# Patient Record
Sex: Male | Born: 1980 | Race: White | Hispanic: No | Smoking: Current every day smoker
Health system: Southern US, Community
[De-identification: ages and names within clinical notes are randomized; demographics above are authoritative.]

## PROBLEM LIST (undated history)

## (undated) DIAGNOSIS — C801 Malignant (primary) neoplasm, unspecified: Secondary | ICD-10-CM

## (undated) DIAGNOSIS — F1910 Other psychoactive substance abuse, uncomplicated: Secondary | ICD-10-CM

## (undated) DIAGNOSIS — F419 Anxiety disorder, unspecified: Secondary | ICD-10-CM

## (undated) HISTORY — DX: Malignant (primary) neoplasm, unspecified: C80.1

---

## 2004-06-26 ENCOUNTER — Emergency Department (HOSPITAL_COMMUNITY): Admission: EM | Admit: 2004-06-26 | Discharge: 2004-06-26 | Payer: Self-pay | Admitting: Emergency Medicine

## 2004-07-08 ENCOUNTER — Ambulatory Visit: Payer: Self-pay | Admitting: Psychiatry

## 2004-07-08 ENCOUNTER — Inpatient Hospital Stay (HOSPITAL_COMMUNITY): Admission: AD | Admit: 2004-07-08 | Discharge: 2004-07-10 | Payer: Self-pay | Admitting: Psychiatry

## 2004-07-08 ENCOUNTER — Encounter: Payer: Self-pay | Admitting: Emergency Medicine

## 2004-09-03 ENCOUNTER — Emergency Department (HOSPITAL_COMMUNITY): Admission: EM | Admit: 2004-09-03 | Discharge: 2004-09-03 | Payer: Self-pay | Admitting: Emergency Medicine

## 2004-10-14 ENCOUNTER — Emergency Department (HOSPITAL_COMMUNITY): Admission: EM | Admit: 2004-10-14 | Discharge: 2004-10-14 | Payer: Self-pay | Admitting: Emergency Medicine

## 2005-12-08 ENCOUNTER — Inpatient Hospital Stay (HOSPITAL_COMMUNITY): Admission: EM | Admit: 2005-12-08 | Discharge: 2005-12-13 | Payer: Self-pay

## 2006-07-27 ENCOUNTER — Emergency Department (HOSPITAL_COMMUNITY): Admission: EM | Admit: 2006-07-27 | Discharge: 2006-07-28 | Payer: Self-pay | Admitting: Emergency Medicine

## 2009-06-08 ENCOUNTER — Emergency Department (HOSPITAL_COMMUNITY): Admission: EM | Admit: 2009-06-08 | Discharge: 2009-06-09 | Payer: Self-pay | Admitting: Emergency Medicine

## 2010-08-27 ENCOUNTER — Emergency Department (HOSPITAL_COMMUNITY)
Admission: EM | Admit: 2010-08-27 | Discharge: 2010-08-27 | Disposition: A | Discharge status: Home / Self Care | Payer: Self-pay | Attending: Emergency Medicine | Admitting: Emergency Medicine

## 2010-08-27 ENCOUNTER — Emergency Department (HOSPITAL_COMMUNITY): Payer: Self-pay

## 2010-08-27 DIAGNOSIS — L03119 Cellulitis of unspecified part of limb: Secondary | ICD-10-CM | POA: Insufficient documentation

## 2010-08-27 DIAGNOSIS — F17200 Nicotine dependence, unspecified, uncomplicated: Secondary | ICD-10-CM | POA: Insufficient documentation

## 2010-08-27 DIAGNOSIS — M25579 Pain in unspecified ankle and joints of unspecified foot: Secondary | ICD-10-CM | POA: Insufficient documentation

## 2010-08-27 DIAGNOSIS — L02419 Cutaneous abscess of limb, unspecified: Secondary | ICD-10-CM | POA: Insufficient documentation

## 2010-08-27 LAB — DIFFERENTIAL
Basophils Absolute: 0 10*3/uL (ref 0.0–0.1)
Basophils Relative: 0 % (ref 0–1)
Eosinophils Relative: 1 % (ref 0–5)
Lymphocytes Relative: 25 % (ref 12–46)
Lymphs Abs: 2.8 10*3/uL (ref 0.7–4.0)
Monocytes Absolute: 1.1 10*3/uL — ABNORMAL HIGH (ref 0.1–1.0)
Neutro Abs: 7.1 10*3/uL (ref 1.7–7.7)
Neutrophils Relative %: 64 % (ref 43–77)

## 2010-08-27 LAB — CBC
HCT: 39.3 % (ref 39.0–52.0)
MCH: 33 pg (ref 26.0–34.0)
MCHC: 35.4 g/dL (ref 30.0–36.0)
MCV: 93.3 fL (ref 78.0–100.0)
Platelets: 286 10*3/uL (ref 150–400)
RDW: 12.6 % (ref 11.5–15.5)

## 2010-08-27 LAB — POCT I-STAT, CHEM 8
Calcium, Ion: 1.2 mmol/L (ref 1.12–1.32)
Glucose, Bld: 115 mg/dL — ABNORMAL HIGH (ref 70–99)
HCT: 43 % (ref 39.0–52.0)
Hemoglobin: 14.6 g/dL (ref 13.0–17.0)
Potassium: 3.7 mEq/L (ref 3.5–5.1)
Sodium: 139 mEq/L (ref 135–145)
TCO2: 27 mmol/L (ref 0–100)
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
## 2010-12-10 ENCOUNTER — Emergency Department (HOSPITAL_COMMUNITY)
Admission: EM | Admit: 2010-12-10 | Discharge: 2010-12-10 | Disposition: A | Discharge status: Home / Self Care | Payer: Self-pay | Attending: Emergency Medicine | Admitting: Emergency Medicine

## 2010-12-10 ENCOUNTER — Emergency Department (HOSPITAL_COMMUNITY): Payer: Self-pay

## 2010-12-10 DIAGNOSIS — M79609 Pain in unspecified limb: Secondary | ICD-10-CM | POA: Insufficient documentation

## 2010-12-10 DIAGNOSIS — M7989 Other specified soft tissue disorders: Secondary | ICD-10-CM | POA: Insufficient documentation

## 2010-12-27 ENCOUNTER — Other Ambulatory Visit (HOSPITAL_COMMUNITY): Payer: Self-pay | Admitting: Orthopedic Surgery

## 2010-12-27 DIAGNOSIS — M7989 Other specified soft tissue disorders: Secondary | ICD-10-CM

## 2010-12-29 ENCOUNTER — Inpatient Hospital Stay (HOSPITAL_COMMUNITY): Admission: RE | Admit: 2010-12-29 | Payer: Self-pay | Source: Ambulatory Visit

## 2011-01-02 ENCOUNTER — Ambulatory Visit (HOSPITAL_COMMUNITY)
Admission: RE | Admit: 2011-01-02 | Discharge: 2011-01-02 | Disposition: A | Discharge status: Home / Self Care | Payer: Self-pay | Source: Ambulatory Visit | Attending: Orthopedic Surgery | Admitting: Orthopedic Surgery

## 2011-01-02 DIAGNOSIS — M65839 Other synovitis and tenosynovitis, unspecified forearm: Secondary | ICD-10-CM | POA: Insufficient documentation

## 2011-01-02 DIAGNOSIS — M25549 Pain in joints of unspecified hand: Secondary | ICD-10-CM | POA: Insufficient documentation

## 2011-01-02 DIAGNOSIS — M25449 Effusion, unspecified hand: Secondary | ICD-10-CM | POA: Insufficient documentation

## 2011-01-02 DIAGNOSIS — R229 Localized swelling, mass and lump, unspecified: Secondary | ICD-10-CM | POA: Insufficient documentation

## 2011-01-02 DIAGNOSIS — M7989 Other specified soft tissue disorders: Secondary | ICD-10-CM

## 2011-01-02 MED ORDER — GADOBENATE DIMEGLUMINE 529 MG/ML IV SOLN
15.0000 mL | Freq: Once | INTRAVENOUS | Status: AC
  Administered 2011-01-02: 15 mL via INTRAVENOUS

## 2012-08-02 ENCOUNTER — Encounter (HOSPITAL_COMMUNITY): Payer: Self-pay | Admitting: Emergency Medicine

## 2012-08-02 ENCOUNTER — Emergency Department (HOSPITAL_COMMUNITY)
Admission: EM | Admit: 2012-08-02 | Discharge: 2012-08-02 | Disposition: A | Discharge status: Home / Self Care | Payer: Self-pay | Attending: Emergency Medicine | Admitting: Emergency Medicine

## 2012-08-02 ENCOUNTER — Emergency Department (HOSPITAL_COMMUNITY): Payer: Self-pay

## 2012-08-02 DIAGNOSIS — R3915 Urgency of urination: Secondary | ICD-10-CM | POA: Insufficient documentation

## 2012-08-02 DIAGNOSIS — R350 Frequency of micturition: Secondary | ICD-10-CM | POA: Insufficient documentation

## 2012-08-02 DIAGNOSIS — F17200 Nicotine dependence, unspecified, uncomplicated: Secondary | ICD-10-CM | POA: Insufficient documentation

## 2012-08-02 DIAGNOSIS — R300 Dysuria: Secondary | ICD-10-CM | POA: Insufficient documentation

## 2012-08-02 LAB — COMPREHENSIVE METABOLIC PANEL
ALT: 15 U/L (ref 0–53)
Albumin: 4.8 g/dL (ref 3.5–5.2)
Alkaline Phosphatase: 71 U/L (ref 39–117)
BUN: 9 mg/dL (ref 6–23)
CO2: 28 mEq/L (ref 19–32)
Calcium: 9.7 mg/dL (ref 8.4–10.5)
GFR calc Af Amer: >90 mL/min (ref >90)
Potassium: 3.9 mEq/L (ref 3.5–5.1)
Sodium: 139 mEq/L (ref 135–145)
Total Bilirubin: 0.2 mg/dL — ABNORMAL LOW (ref 0.3–1.2)
Total Protein: 7.5 g/dL (ref 6.0–8.3)

## 2012-08-02 LAB — CBC WITH DIFFERENTIAL/PLATELET
Basophils Absolute: 0.1 10*3/uL (ref 0.0–0.1)
Basophils Relative: 1 % (ref 0–1)
Eosinophils Absolute: 0.2 10*3/uL (ref 0.0–0.7)
Eosinophils Relative: 2 % (ref 0–5)
HCT: 43.1 % (ref 39.0–52.0)
Hemoglobin: 15.4 g/dL (ref 13.0–17.0)
Lymphs Abs: 2.2 10*3/uL (ref 0.7–4.0)
MCH: 33.3 pg (ref 26.0–34.0)
MCHC: 35.7 g/dL (ref 30.0–36.0)
MCV: 93.1 fL (ref 78.0–100.0)
Monocytes Absolute: 0.7 10*3/uL (ref 0.1–1.0)
Neutro Abs: 5 10*3/uL (ref 1.7–7.7)
Neutrophils Relative %: 61 % (ref 43–77)
Platelets: 316 10*3/uL (ref 150–400)

## 2012-08-02 LAB — URINALYSIS, ROUTINE W REFLEX MICROSCOPIC
Bilirubin Urine: NEGATIVE
Glucose, UA: NEGATIVE mg/dL
Ketones, ur: NEGATIVE mg/dL
Leukocytes, UA: NEGATIVE
Nitrite: NEGATIVE
Protein, ur: NEGATIVE mg/dL
Specific Gravity, Urine: 1.005 (ref 1.005–1.030)
Urobilinogen, UA: 0.2 mg/dL (ref 0.0–1.0)

## 2012-08-02 LAB — RAPID URINE DRUG SCREEN, HOSP PERFORMED: Opiates: NOT DETECTED

## 2012-08-02 MED ORDER — ONDANSETRON HCL 4 MG/2ML IJ SOLN
4.0000 mg | Freq: Once | INTRAMUSCULAR | Status: AC
  Administered 2012-08-02: 4 mg via INTRAVENOUS
  Filled 2012-08-02: qty 2

## 2012-08-02 MED ORDER — SODIUM CHLORIDE 0.9 % IV BOLUS (SEPSIS)
1000.0000 mL | Freq: Once | INTRAVENOUS | Status: AC
  Administered 2012-08-02: 1000 mL via INTRAVENOUS

## 2012-08-02 MED ORDER — HYDROMORPHONE HCL PF 1 MG/ML IJ SOLN
1.0000 mg | Freq: Once | INTRAMUSCULAR | Status: AC
  Administered 2012-08-02: 1 mg via INTRAVENOUS
  Filled 2012-08-02: qty 1

## 2012-08-02 MED ORDER — PHENAZOPYRIDINE HCL 200 MG PO TABS: 200.0000 mg | ORAL_TABLET | Freq: Three times a day (TID) | ORAL | Status: DC

## 2012-08-02 MED ORDER — CEFTRIAXONE SODIUM 250 MG IJ SOLR
250.0000 mg | Freq: Once | INTRAMUSCULAR | Status: AC
  Administered 2012-08-02: 250 mg via INTRAMUSCULAR
  Filled 2012-08-02: qty 250

## 2012-08-02 MED ORDER — MORPHINE SULFATE 4 MG/ML IJ SOLN
4.0000 mg | Freq: Once | INTRAMUSCULAR | Status: AC
  Administered 2012-08-02: 4 mg via INTRAVENOUS
  Filled 2012-08-02: qty 1

## 2012-08-02 MED ORDER — LORAZEPAM 2 MG/ML IJ SOLN
1.0000 mg | Freq: Once | INTRAMUSCULAR | Status: AC
  Administered 2012-08-02: 1 mg via INTRAVENOUS
  Filled 2012-08-02: qty 1

## 2012-08-02 MED ORDER — AZITHROMYCIN 250 MG PO TABS
1000.0000 mg | ORAL_TABLET | Freq: Once | ORAL | Status: AC
  Administered 2012-08-02: 1000 mg via ORAL
  Filled 2012-08-02: qty 4

## 2012-08-02 MED ORDER — HYDROCODONE-ACETAMINOPHEN 5-325 MG PO TABS: 2.0000 | ORAL_TABLET | ORAL | Status: DC | PRN

## 2012-08-02 NOTE — ED Notes (Signed)
Pt states "it feels like I always have to go to the bathroom." Pt states its hard to go to the bathroom when he does have to go; pt alert and mentating appropriately.

## 2012-08-02 NOTE — ED Provider Notes (Signed)
History     CSN: [id number]  Arrival date & time 08/02/12  1245   First MD Initiated Contact with Patient 08/02/12 1303      Chief Complaint  Patient presents with  . Abdominal Pain    (Consider location/radiation/quality/duration/timing/severity/associated sxs/prior treatment) HPI Comments: Patient complains of suprapubic pain and pressure for the past 2 days. He states he has urinary frequency was only able to void small amounts. States he was able to urinate at all yesterday except for a few drops. Denies fever, chills, nausea vomiting. Denies testicular pain or penile discharge. Denies any back pain. . No history of kidney stones or urinary tract infections.  Patient is a 32 y.o. male presenting with abdominal pain. The history is provided by the patient.  Abdominal Pain The primary symptoms of the illness include abdominal pain and dysuria. The primary symptoms of the illness do not include fever, shortness of breath, nausea or vomiting.  The dysuria is associated with frequency and urgency. The dysuria is not associated with hematuria.   Additional symptoms associated with the illness include urgency and frequency. Symptoms associated with the illness do not include hematuria or back pain.    History reviewed. No pertinent past medical history.  History reviewed. No pertinent past surgical history.  No family history on file.  History  Substance Use Topics  . Smoking status: Current Some Day Smoker  . Smokeless tobacco: Not on file  . Alcohol Use: No      Review of Systems  Constitutional: Negative for fever and activity change.  HENT: Negative for congestion and rhinorrhea.   Respiratory: Negative for cough, chest tightness and shortness of breath.   Cardiovascular: Negative for chest pain.  Gastrointestinal: Positive for abdominal pain. Negative for nausea and vomiting.  Genitourinary: Positive for dysuria, urgency, frequency, decreased urine volume and difficulty  urinating. Negative for hematuria, flank pain, penile swelling, scrotal swelling and testicular pain.  Musculoskeletal: Negative for back pain.  Skin: Negative for rash.  Neurological: Negative for dizziness, weakness and headaches.  A complete 10 system review of systems was obtained and all systems are negative except as noted in the HPI and PMH.    Allergies  Penicillins  Home Medications   Current Outpatient Rx  Name  Route  Sig  Dispense  Refill  . HYDROCODONE-ACETAMINOPHEN 5-325 MG PO TABS   Oral   Take 2 tablets by mouth every 4 (four) hours as needed for pain.   10 tablet   0   . PHENAZOPYRIDINE HCL 200 MG PO TABS   Oral   Take 1 tablet (200 mg total) by mouth 3 (three) times daily.   6 tablet   0     BP 131/75  Pulse 74  Temp 98.4 F (36.9 C) (Oral)  Resp 14  SpO2 96%  Physical Exam  Constitutional: He is oriented to person, place, and time. He appears well-developed and well-nourished. He appears distressed.       Uncomfortable, walking around room  HENT:  Head: Normocephalic and atraumatic.  Mouth/Throat: Oropharynx is clear and moist. No oropharyngeal exudate.  Eyes: Conjunctivae normal and EOM are normal. Pupils are equal, round, and reactive to light.  Neck: Normal range of motion. Neck supple.  Cardiovascular: Normal rate, regular rhythm and normal heart sounds.   No murmur heard. Pulmonary/Chest: Effort normal and breath sounds normal. No respiratory distress.  Abdominal: Soft. There is tenderness. There is no rebound and no guarding.       Suprapubic  tenderness with palpable bladder  Musculoskeletal: Normal range of motion. He exhibits no edema and no tenderness.       No CVAT  Neurological: He is alert and oriented to person, place, and time. No cranial nerve deficit. He exhibits normal muscle tone. Coordination normal.       5/5 strength in bilateral lower extremities. Ankle plantar and dorsiflexion intact. Great toe extension intact bilaterally.  +2 DP and PT pulses. +2 patellar reflexes bilaterally. Normal gait.   Skin: Skin is warm.    ED Course  Procedures (including critical care time)  Labs Reviewed  URINALYSIS, ROUTINE W REFLEX MICROSCOPIC - Abnormal; Notable for the following:    Hgb urine dipstick TRACE (*)     All other components within normal limits  COMPREHENSIVE METABOLIC PANEL - Abnormal; Notable for the following:    Glucose, Bld 104 (*)     Total Bilirubin 0.2 (*)     All other components within normal limits  GLUCOSE, CAPILLARY - Abnormal; Notable for the following:    Glucose-Capillary 106 (*)     All other components within normal limits  CBC WITH DIFFERENTIAL  URINE MICROSCOPIC-ADD ON  GC/CHLAMYDIA PROBE AMP  URINE CULTURE  URINE RAPID DRUG SCREEN (HOSP PERFORMED)   Ct Abdomen Pelvis Wo Contrast  08/02/2012  *RADIOLOGY REPORT*  Clinical Data: Trouble urinating, frequent urination, lower abdominal pain and pressure  CT ABDOMEN AND PELVIS WITHOUT CONTRAST  Technique:  Multidetector CT imaging of the abdomen and pelvis was performed following the standard protocol without intravenous contrast.  Comparison: CT abdomen pelvis - 12/08/2005  Findings:  The lack of intravenous contrast limits the ability to evaluate solid abdominal organs.  Normal noncontrast appearance of the bilateral kidneys.  No renal stones.  No stones are seen within the expected location of either ureter or the urinary bladder.  The urinary bladder is normal in degree of distension.  No urine obstruction or perinephric stranding.  Minimal prostatic calcifications.  No free fluid in the pelvis.  Normal noncontrast appearance of the bilateral adrenal glands, pancreas and spleen.  Incidental note is made of a small splenule.  Normal hepatic contour.  Normal noncontrast appearance of the gallbladder.  No ascites.  Moderate to large colonic stool burden without evidence of obstruction.  The bowel is otherwise normal in course and caliber without wall  thickening.  Normal noncontrast appearance of the appendix.  No pneumoperitoneum, pneumatosis or portal venous gas.  Normal caliber of the abdominal aorta.  No definite retroperitoneal, mesenteric, pelvic or inguinal lymphadenopathy on this noncontrast examination.   Limited visualization of the lower thorax demonstrates an approximately 3 mm nodule within the left lower lobe (image six, series 4) favored to be sequela of prior infection or inflammation. No focal airspace opacity.  No pleural effusion.  Normal heart size.  No pericardial effusion.  No acute or aggressive osseous abnormalities.  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  IMPRESSION:  1.  No explanation for patient's dysuria.  Specifically, no evidence of nephrolithiasis urinary obstruction.  2.  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   Original Report Authenticated By: Tacey Ruiz, MD      1. Dysuria       MDM  Patient presents with suprapubic pressure, inability to urinate for the past 2 days with small amounts and frequency. Patient appears very anxious but in no distress. No  CVA tenderness. Nonfocal neuro exam.  Urinalysis shows trace blood but nothing else. Patient has 284 mL on bladder scan. Creatinine normal.  Foley catheter was attempted. the patient refuses. Patient also refuses rectal exam.  CT scan shows no hydronephrosis or urinary stones. Bladder appears to be normally distended.  Patient's dysuria may be secondary to urethritis. However, he does appear to also be having mild urinary retention.  Denies any drug or anticholinergic use. No focal lower extremity deficits, no back pain. Doubt cauda equina syndrome.Given empiric Rocephin and azithromycin. GC/chlamydia sent. Will follow up with urology.       Glynn Octave, MD 08/02/12 [phone]

## 2012-08-02 NOTE — ED Notes (Signed)
Pt. Stated, i've not been able to use the bathroom in 2 days. Urinate. I'm having a a lot of pressure in the lower stomach.  i have to go constantly but dont go but hardly any.

## 2012-08-02 NOTE — ED Notes (Signed)
Scanned patients bladder, patient had 284 mls in the bladder.

## 2012-08-02 NOTE — ED Notes (Signed)
Pt ambulatory leaving ED with d/c teaching and prescriptions; pt has been instructed not to drive; pt states he is calling a ride to come pick him up since he was dropped off here; pt does not appear to be in distress upon d/c from ED; pt verbalized understanding of d/c teaching and prescriptions; pt educated on importance of flow-up care with urologist; pt has no further questions upon d/c.

## 2012-08-02 NOTE — ED Notes (Signed)
Pt does not appear to be in acute distress; pt does not have difficulty breathing; pt ambulating around room; pt alert and mentating appropriately; pt denies itching and numbness/tingling; pt denies shortness of breath.

## 2012-08-02 NOTE — ED Notes (Signed)
Pt denies n/v/d. Pt denies dizziness/lightheadedness; pt denies chest pain; pt denies shortness of breath; pt alert and mentating appropriately.

## 2012-08-04 LAB — URINE CULTURE
Colony Count: NO GROWTH
Culture: NO GROWTH

## 2012-10-07 ENCOUNTER — Encounter (HOSPITAL_COMMUNITY): Payer: Self-pay | Admitting: *Deleted

## 2012-10-07 ENCOUNTER — Emergency Department (HOSPITAL_COMMUNITY)
Admission: EM | Admit: 2012-10-07 | Discharge: 2012-10-07 | Disposition: A | Discharge status: Home / Self Care | Payer: Self-pay | Attending: Emergency Medicine | Admitting: Emergency Medicine

## 2012-10-07 DIAGNOSIS — J029 Acute pharyngitis, unspecified: Secondary | ICD-10-CM | POA: Insufficient documentation

## 2012-10-07 DIAGNOSIS — F17200 Nicotine dependence, unspecified, uncomplicated: Secondary | ICD-10-CM | POA: Insufficient documentation

## 2012-10-07 DIAGNOSIS — R6883 Chills (without fever): Secondary | ICD-10-CM | POA: Insufficient documentation

## 2012-10-07 LAB — RAPID STREP SCREEN (MED CTR MEBANE ONLY): Streptococcus, Group A Screen (Direct): NEGATIVE

## 2012-10-07 MED ORDER — HYDROCODONE-ACETAMINOPHEN 7.5-325 MG/15ML PO SOLN
10.0000 mL | Freq: Once | ORAL | Status: AC
  Administered 2012-10-07: 10 mL via ORAL
  Filled 2012-10-07: qty 15

## 2012-10-07 MED ORDER — AZITHROMYCIN 250 MG PO TABS
1000.0000 mg | ORAL_TABLET | Freq: Once | ORAL | Status: AC
  Administered 2012-10-07: 1000 mg via ORAL
  Filled 2012-10-07: qty 4

## 2012-10-07 MED ORDER — PERCOCET 5-325 MG PO TABS: 1.0000 | ORAL_TABLET | Freq: Four times a day (QID) | ORAL | Status: DC | PRN

## 2012-10-07 MED ORDER — AZITHROMYCIN 250 MG PO TABS: 250.0000 mg | ORAL_TABLET | Freq: Every day | ORAL | Status: DC

## 2012-10-07 MED ORDER — DEXAMETHASONE SODIUM PHOSPHATE 10 MG/ML IJ SOLN
10.0000 mg | Freq: Once | INTRAMUSCULAR | Status: AC
  Administered 2012-10-07: 10 mg via INTRAMUSCULAR
  Filled 2012-10-07: qty 1

## 2012-10-07 NOTE — ED Provider Notes (Signed)
History    This chart was scribed for non-physician practitioner Jaci Carrel, PA-C working with Raeford Razor, MD by Gerlean Ren, ED Scribe. This patient was seen in room TR10C/TR10C and the patient's care was started at 6:53 PM.   CSN: [id number]  Arrival date & time 10/07/12  1757   None     Chief Complaint  Patient presents with  . Otalgia  . Sore Throat    The history is provided by the patient. No language interpreter was used.  Reginald Patton is a 32 y.o. male who presents to the Emergency Department complaining of constant gradually worsening sore throat that began at 9:00 PM last night.  Pt reports he noticed white spots in posterior pharynx today.  Pain worsened when swallowing, not improved by ibuprofen.  Associated left side otalgia, chills, and myalgias.  Pt denies fatigue, cough, abdominal pain.  No sick contacts.  Pt is a current everyday smoker.  History reviewed. No pertinent past medical history.  History reviewed. No pertinent past surgical history.  No family history on file.  History  Substance Use Topics  . Smoking status: Current Every Day Smoker -- 1.00 packs/day    Types: Cigarettes  . Smokeless tobacco: Not on file  . Alcohol Use: No      Review of Systems  Constitutional: Positive for chills. Negative for fever.  HENT: Positive for ear pain, sore throat and trouble swallowing.   Respiratory: Negative for cough.   Gastrointestinal: Negative for abdominal pain.  Musculoskeletal: Positive for myalgias.  All other systems reviewed and are negative.    Allergies  Penicillins  Home Medications   Current Outpatient Rx  Name  Route  Sig  Dispense  Refill  . ibuprofen (ADVIL,MOTRIN) 200 MG tablet   Oral   Take 600 mg by mouth every 6 (six) hours as needed for pain.         . naproxen sodium (ANAPROX) 220 MG tablet   Oral   Take 440 mg by mouth 2 (two) times daily as needed (for pain).           BP 148/91  Pulse 92  Temp(Src) 98.9 F  (37.2 C) (Oral)  Resp 18  SpO2 99%  Physical Exam  Nursing note and vitals reviewed. Constitutional: He is oriented to person, place, and time. He appears well-developed and well-nourished. No distress.  HENT:  Head: Normocephalic and atraumatic. No trismus in the jaw.  Right Ear: Tympanic membrane, external ear and ear canal normal.  Left Ear: Tympanic membrane, external ear and ear canal normal.  Nose: Nose normal. No rhinorrhea. Right sinus exhibits no maxillary sinus tenderness and no frontal sinus tenderness. Left sinus exhibits no maxillary sinus tenderness and no frontal sinus tenderness.  Mouth/Throat: Uvula is midline and mucous membranes are normal. Normal dentition. No dental abscesses or edematous. Oropharyngeal exudate and posterior oropharyngeal edema present. No posterior oropharyngeal erythema or tonsillar abscesses.  No submental edema, tongue not elevated, no trismus. No impending airway obstruction; Pt able to speak full sentences, swallow intact, no drooling, stridor, or tonsillar/uvula displacement. No palatal petechia  Eyes: Conjunctivae are normal.  Neck: Trachea normal, normal range of motion and full passive range of motion without pain. Neck supple. No rigidity. Normal range of motion present. No Brudzinski's sign noted.  Flexion and extension of neck without pain or difficulty. Able to breath without difficulty in extension.  Cardiovascular: Normal rate and regular rhythm.   Pulmonary/Chest: Effort normal and breath sounds normal. No stridor.  No respiratory distress. He has no wheezes.  Abdominal: Soft. There is no tenderness.  No obvious evidence of splenomegaly. Non ttp.   Musculoskeletal: Normal range of motion.  Lymphadenopathy:       Head (right side): No preauricular and no posterior auricular adenopathy present.       Head (left side): No preauricular and no posterior auricular adenopathy present.    He has cervical adenopathy.  Neurological: He is alert  and oriented to person, place, and time.  Skin: Skin is warm and dry. No rash noted. He is not diaphoretic.  Psychiatric: He has a normal mood and affect.    ED Course  Procedures (including critical care time) DIAGNOSTIC STUDIES: Oxygen Saturation is 99% on room air, normal by my interpretation.    COORDINATION OF CARE: 6:59 PM- Patient informed of clinical course, understands medical decision-making process, and agrees with plan.    Results for orders placed during the hospital encounter of 10/07/12  RAPID STREP SCREEN      Result Value Range   Streptococcus, Group A Screen (Direct) NEGATIVE  NEGATIVE    No results found.   No diagnosis found. Medications  dexamethasone (DECADRON) injection 10 mg (not administered)  azithromycin (ZITHROMAX) tablet 1,000 mg (not administered)  HYDROcodone-acetaminophen (HYCET) 7.5-325 mg/15 ml solution 10 mL (not administered)      MDM  Sore throat, tonsilar exudate Pt appears non-toxic, VS normal.  Pt presents with sore throat onset last night.  Informed pt that strep test is negative, but that I will treat pt with antibiotics as his symptoms correlate w bacterial infection (tactile fever at home, acute onset sore throat, exudate, no cough). Presentation non concerning for PTA or infxn spread to soft tissue. No trismus or uvula deviation. Specific return precautions discussed. Pt able to drink water in ED without difficulty with intact air way. Pt is PCN allergic, will tx w azithromycin.   Pt requests pain medication.  Informed pt I can provide short term while he is out of work for the next 2 days.  Specific return precautions discussed.  Recommended PCP follow up.       I personally performed the services described in this documentation, which was scribed in my presence. The recorded information has been reviewed and is accurate.      Jaci Carrel, [place] 10/07/12 1934

## 2012-10-07 NOTE — ED Notes (Signed)
Pt comfortable with d/c and f/u instructions. Given prescriptions x2.

## 2012-10-07 NOTE — ED Notes (Signed)
Pt with L throat pain and L ear pain since last night.  Redness and white patches noted to L pharynx only.

## 2012-10-14 NOTE — ED Provider Notes (Signed)
Medical screening examination/treatment/procedure(s) were performed by non-physician practitioner and as supervising physician I was immediately available for consultation/collaboration.  Trashaun Streight, MD 10/14/12 1613 

## 2013-01-16 ENCOUNTER — Encounter (HOSPITAL_COMMUNITY): Payer: Self-pay | Admitting: *Deleted

## 2013-01-16 ENCOUNTER — Emergency Department (HOSPITAL_COMMUNITY)
Admission: EM | Admit: 2013-01-16 | Discharge: 2013-01-17 | Disposition: A | Discharge status: Home / Self Care | Payer: Self-pay | Attending: Emergency Medicine | Admitting: Emergency Medicine

## 2013-01-16 DIAGNOSIS — F17200 Nicotine dependence, unspecified, uncomplicated: Secondary | ICD-10-CM | POA: Insufficient documentation

## 2013-01-16 DIAGNOSIS — IMO0001 Reserved for inherently not codable concepts without codable children: Secondary | ICD-10-CM | POA: Insufficient documentation

## 2013-01-16 DIAGNOSIS — Z880 Allergy status to penicillin: Secondary | ICD-10-CM | POA: Insufficient documentation

## 2013-01-16 DIAGNOSIS — F1110 Opioid abuse, uncomplicated: Secondary | ICD-10-CM | POA: Insufficient documentation

## 2013-01-16 HISTORY — DX: Other psychoactive substance abuse, uncomplicated: F19.10

## 2013-01-16 LAB — CBC
HCT: 39.7 % (ref 39.0–52.0)
Hemoglobin: 14.4 g/dL (ref 13.0–17.0)
MCV: 90.4 fL (ref 78.0–100.0)
RBC: 4.39 MIL/uL (ref 4.22–5.81)
RDW: 11.8 % (ref 11.5–15.5)
WBC: 11.5 10*3/uL — ABNORMAL HIGH (ref 4.0–10.5)

## 2013-01-16 LAB — COMPREHENSIVE METABOLIC PANEL
Albumin: 4.2 g/dL (ref 3.5–5.2)
Alkaline Phosphatase: 66 U/L (ref 39–117)
BUN: 8 mg/dL (ref 6–23)
CO2: 28 mEq/L (ref 19–32)
Chloride: 103 mEq/L (ref 96–112)
GFR calc non Af Amer: >90 mL/min (ref >90)
Glucose, Bld: 148 mg/dL — ABNORMAL HIGH (ref 70–99)
Potassium: 3.7 mEq/L (ref 3.5–5.1)
Total Bilirubin: 0.4 mg/dL (ref 0.3–1.2)

## 2013-01-16 LAB — ACETAMINOPHEN LEVEL: Acetaminophen (Tylenol), Serum: <15 ug/mL (ref 10–30)

## 2013-01-16 LAB — RAPID URINE DRUG SCREEN, HOSP PERFORMED
Amphetamines: NOT DETECTED
Barbiturates: NOT DETECTED
Benzodiazepines: NOT DETECTED

## 2013-01-16 LAB — ETHANOL: Alcohol, Ethyl (B): <11 mg/dL (ref 0–11)

## 2013-01-16 LAB — POCT I-STAT TROPONIN I: Troponin i, poc: 0.01 ng/mL (ref 0.00–0.08)

## 2013-01-16 MED ORDER — LOPERAMIDE HCL 2 MG PO CAPS: 2.0000 mg | ORAL_CAPSULE | ORAL | Status: DC | PRN

## 2013-01-16 MED ORDER — CLONIDINE HCL 0.1 MG PO TABS: 0.1000 mg | ORAL_TABLET | ORAL | Status: DC

## 2013-01-16 MED ORDER — NAPROXEN 250 MG PO TABS
500.0000 mg | ORAL_TABLET | Freq: Two times a day (BID) | ORAL | Status: DC | PRN
  Administered 2013-01-16: 500 mg via ORAL
  Filled 2013-01-16: qty 2

## 2013-01-16 MED ORDER — HYDROXYZINE HCL 25 MG PO TABS
25.0000 mg | ORAL_TABLET | Freq: Four times a day (QID) | ORAL | Status: DC | PRN
  Administered 2013-01-16 – 2013-01-17 (×3): 25 mg via ORAL
  Filled 2013-01-16 (×3): qty 1

## 2013-01-16 MED ORDER — ZOLPIDEM TARTRATE 5 MG PO TABS
5.0000 mg | ORAL_TABLET | Freq: Every evening | ORAL | Status: DC | PRN
  Administered 2013-01-16: 5 mg via ORAL
  Filled 2013-01-16: qty 1

## 2013-01-16 MED ORDER — CLONIDINE HCL 0.1 MG PO TABS: 0.1000 mg | ORAL_TABLET | Freq: Every day | ORAL | Status: DC

## 2013-01-16 MED ORDER — ALUM & MAG HYDROXIDE-SIMETH 200-200-20 MG/5ML PO SUSP: 30.0000 mL | ORAL | Status: DC | PRN

## 2013-01-16 MED ORDER — IBUPROFEN 400 MG PO TABS
600.0000 mg | ORAL_TABLET | Freq: Three times a day (TID) | ORAL | Status: DC | PRN
  Administered 2013-01-17 (×2): 600 mg via ORAL
  Filled 2013-01-16 (×2): qty 1

## 2013-01-16 MED ORDER — LORAZEPAM 1 MG PO TABS
1.0000 mg | ORAL_TABLET | Freq: Three times a day (TID) | ORAL | Status: DC | PRN
  Administered 2013-01-16 – 2013-01-17 (×2): 1 mg via ORAL
  Filled 2013-01-16 (×2): qty 1

## 2013-01-16 MED ORDER — ONDANSETRON 4 MG PO TBDP: 4.0000 mg | ORAL_TABLET | Freq: Four times a day (QID) | ORAL | Status: DC | PRN

## 2013-01-16 MED ORDER — METHOCARBAMOL 500 MG PO TABS
500.0000 mg | ORAL_TABLET | Freq: Three times a day (TID) | ORAL | Status: DC | PRN
  Administered 2013-01-16 – 2013-01-17 (×2): 500 mg via ORAL
  Filled 2013-01-16 (×2): qty 1

## 2013-01-16 MED ORDER — NICOTINE 21 MG/24HR TD PT24
21.0000 mg | MEDICATED_PATCH | Freq: Every day | TRANSDERMAL | Status: DC
  Administered 2013-01-16: 21 mg via TRANSDERMAL
  Filled 2013-01-16: qty 1

## 2013-01-16 MED ORDER — ONDANSETRON HCL 4 MG PO TABS: 4.0000 mg | ORAL_TABLET | Freq: Three times a day (TID) | ORAL | Status: DC | PRN

## 2013-01-16 MED ORDER — DICYCLOMINE HCL 20 MG PO TABS
20.0000 mg | ORAL_TABLET | Freq: Four times a day (QID) | ORAL | Status: DC | PRN
  Administered 2013-01-16: 20 mg via ORAL
  Filled 2013-01-16: qty 1

## 2013-01-16 MED ORDER — CLONIDINE HCL 0.1 MG PO TABS
0.1000 mg | ORAL_TABLET | Freq: Four times a day (QID) | ORAL | Status: DC
  Administered 2013-01-16 – 2013-01-17 (×3): 0.1 mg via ORAL
  Filled 2013-01-16 (×3): qty 1

## 2013-01-16 NOTE — ED Provider Notes (Signed)
   History    CSN: [id number] Arrival date & time 01/16/13  1505  First MD Initiated Contact with Patient 01/16/13 2123     Chief Complaint  Patient presents with  . Medical Clearance   (Consider location/radiation/quality/duration/timing/severity/associated sxs/prior Treatment) HPI Comments: Patient reports he has been taking PO narcotics for many years.  Uses any pills he can get his hands on.  Last use noon today.  States he wants help with his addiction now because he "can't take feeling like this anymore," states he has been going through withdrawals - defined as pain all over, arms and legs moving around, unable to sleep.  Denies recent illness, fevers, chills, CP, SOB, cough, abdominal pain, N/V/D.  The history is provided by the patient.   No past medical history on file. No past surgical history on file. No family history on file. History  Substance Use Topics  . Smoking status: Current Every Day Smoker -- 1.00 packs/day    Types: Cigarettes  . Smokeless tobacco: Not on file  . Alcohol Use: Yes     Comment: occ    Review of Systems  Constitutional: Negative for fever.  Respiratory: Negative for cough and shortness of breath.   Cardiovascular: Negative for chest pain.  Gastrointestinal: Negative for nausea, vomiting, abdominal pain and diarrhea.  Musculoskeletal: Positive for myalgias.  Neurological: Negative for weakness and numbness.    Allergies  Penicillins  Home Medications  No current outpatient prescriptions on file. BP 126/81  Pulse 86  Temp(Src) 98.7 F (37.1 C) (Oral)  Resp 18  SpO2 99% Physical Exam  Nursing note and vitals reviewed. Constitutional: He appears well-developed and well-nourished. No distress.  Neck: Neck supple.  Cardiovascular: Normal rate, regular rhythm and intact distal pulses.   Pulmonary/Chest: Effort normal and breath sounds normal. No respiratory distress. He has no wheezes. He has no rales.  Abdominal: Soft. He exhibits  no distension. There is no tenderness. There is no rebound and no guarding.  Musculoskeletal: He exhibits no edema.  Neurological: He is alert.  Skin: He is not diaphoretic.    ED Course  Procedures (including critical care time) Labs Reviewed  CBC - Abnormal; Notable for the following:    WBC 11.5 (*)    MCHC 36.3 (*)    All other components within normal limits  COMPREHENSIVE METABOLIC PANEL - Abnormal; Notable for the following:    Glucose, Bld 148 (*)    All other components within normal limits  SALICYLATE LEVEL - Abnormal; Notable for the following:    Salicylate Lvl <2.0 (*)    All other components within normal limits  URINE RAPID DRUG SCREEN (HOSP PERFORMED) - Abnormal; Notable for the following:    Opiates POSITIVE (*)    All other components within normal limits  ETHANOL  ACETAMINOPHEN LEVEL  POCT I-STAT TROPONIN I   No results found.  9:47 PM Discussed with Marchelle Folks from ACT team who will see patient.   1. Narcotic abuse     MDM  Pt with several years of PO narcotic abuse, came in today requesting detox.  Mild withdrawal symptoms.  Pt placed on narcotic withdrawal protocol and psych holding orders placed.  Has been seen by ACT who will attempt to place.  No SI or HI.  No concerning withdrawal symptoms.  Dr Lavella Lemons made aware of patient and assumes his care at change of shift.   [place], PA-C 01/17/13 0101

## 2013-01-16 NOTE — ED Notes (Signed)
Pt report opiate abuse x "couple year". Pt tearful, states "I just feel horrible and I want to get all of this out of my system. I couldn't sleep last night because I can't stop moving my legs." Pt reports taking appx 13 pills/day of "anything I can get my hands on." Pt reports taking 7.5mg   Vicodin x 5 at 1200 today.

## 2013-01-16 NOTE — BH Assessment (Signed)
Assessment Note   Reginald Patton is an 32 y.o. male who presents seeking detox.  He reports that he has been using as many opiates as possible for the last several years and that he has never sought treatment, however he is tired of living like this and wants to make a change.  He is not working and is not in school.  He denies any SI, HI, or psychosis now or in the last six months.  He denies any previous MH history.  He is visibly uncomfortable, rolling around in the bed and tearful.  He is motivated for treatment.  He is not a veteran, has no history of assault, does not have any open wounds.    Axis I: Opioid Dependence Axis II: Deferred Axis III: No past medical history on file. Axis IV: problems with access to health care services Axis V: 41-50 serious symptoms  Past Medical History: No past medical history on file.  No past surgical history on file.  Family History: No family history on file.  Social History:  reports that he has been smoking Cigarettes.  He has been smoking about 1.00 pack per day. He does not have any smokeless tobacco history on file. He reports that  drinks alcohol. He reports that he uses illicit drugs.  Additional Social History:  Alcohol / Drug Use History of alcohol / drug use?: Yes Substance #1 Name of Substance 1: Percocet 1 - Age of First Use: late 65s 1 - Amount (size/oz): 10-12 10 mg pills 1 - Frequency: daily 1 - Duration: years 1 - Last Use / Amount: 01/16/13 few Substance #2 Name of Substance 2: Vicodin 2 - Age of First Use: late 48s 2 - Amount (size/oz): intermittently when unable to get percocet 2 - Frequency: varies 2 - Duration: years 2 - Last Use / Amount: 01/16/13 Substance #3 Name of Substance 3: Opana 3 - Age of First Use: late 71s 3 - Amount (size/oz): 5-6 daily 3 - Frequency: daily 3 - Duration: years 3 - Last Use / Amount:  days ago  CIWA: CIWA-Ar BP: 126/81 mmHg Pulse Rate: 86 COWS: Clinical Opiate Withdrawal Scale  (COWS) Resting Pulse Rate: Pulse Rate 81-100 Sweating: Subjective report of chills or flushing Restlessness: Frequent shifting or extraneous movements of legs/arms Pupil Size: Pupils moderately dilated Bone or Joint Aches: Patient reports sever diffuse aching of joints/muscles Runny Nose or Tearing: Nose running or tearing GI Upset: No GI symptoms Tremor: Slight tremor observable Yawning: No yawning Anxiety or Irritability: Patient reports increasing irritability or anxiousness Gooseflesh Skin: Skin is smooth COWS Total Score: 14  Allergies:  Allergies  Allergen Reactions  . Penicillins     Unknown    Home Medications:  (Not in a hospital admission)  OB/GYN Status:  No LMP for male patient.  General Assessment Data Location of Assessment: [facility] ED Living Arrangements: Other relatives;Parent (father, girlfriend) Can pt return to current living arrangement?: Yes Admission Status: Voluntary Is patient capable of signing voluntary admission?: Yes Transfer from: Acute Hospital Referral Source: Self/Family/Friend  Education Status Is patient currently in school?: No Highest grade of school patient has completed: 10  Risk to self Suicidal Ideation: No Suicidal Intent: No Is patient at risk for suicide?: No Suicidal Plan?: No Access to Means: No What has been your use of drugs/alcohol within the last 12 months?: ongoing Previous Attempts/Gestures: No How many times?: 0 Intentional Self Injurious Behavior: None Family Suicide History: No Recent stressful life event(s): Recent negative physical changes  Persecutory voices/beliefs?: No Depression: No Substance abuse history and/or treatment for substance abuse?: Yes Suicide prevention information given to non-admitted patients: Yes  Risk to Others Homicidal Ideation: No Thoughts of Harm to Others: No Current Homicidal Intent: No Current Homicidal Plan: No Access to Homicidal Means: No History of harm to others?:  No Assessment of Violence: None Noted Does patient have access to weapons?: No Criminal Charges Pending?: No Does patient have a court date: No  Psychosis Hallucinations: None noted Delusions: None noted  Mental Status Report Appear/Hygiene: Other (Comment) (unremarkable) Eye Contact: Poor Motor Activity: Agitation Speech: Logical/coherent Level of Consciousness: Restless Mood: Anxious Affect: Appropriate to circumstance Anxiety Level: Moderate Thought Processes: Relevant;Coherent Judgement: Unimpaired Orientation: Time;Place;Person;Situation Obsessive Compulsive Thoughts/Behaviors: None  Cognitive Functioning Concentration: Decreased Memory: Recent Intact;Remote Intact IQ: Average Insight: Good Impulse Control: Poor Appetite: Poor Weight Loss:  (hasn't eaten in days) Sleep: Decreased Vegetative Symptoms: None  ADLScreening [facility] Assessment Services) Patient's cognitive ability adequate to safely complete daily activities?: Yes Patient able to express need for assistance with ADLs?: Yes Independently performs ADLs?: Yes (appropriate for developmental age)  Abuse/Neglect [facility]) Physical Abuse: Denies Verbal Abuse: Denies Sexual Abuse: Denies  Prior Inpatient Therapy Prior Inpatient Therapy: No  Prior Outpatient Therapy Prior Outpatient Therapy: No  ADL Screening (condition at time of admission) Patient's cognitive ability adequate to safely complete daily activities?: Yes Is the patient deaf or have difficulty hearing?: No Does the patient have difficulty seeing, even when wearing glasses/contacts?: No Does the patient have difficulty concentrating, remembering, or making decisions?: No Patient able to express need for assistance with ADLs?: Yes Does the patient have difficulty dressing or bathing?: No Independently performs ADLs?: Yes (appropriate for developmental age) Does the patient have difficulty walking or climbing stairs?: No Weakness of Legs:  None Weakness of Arms/Hands: None       Abuse/Neglect Assessment (Assessment to be complete while patient is alone) Physical Abuse: Denies Verbal Abuse: Denies Sexual Abuse: Denies Exploitation of patient/patient's resources: Denies Values / Beliefs Cultural Requests During Hospitalization: None Spiritual Requests During Hospitalization: None   Advance Directives (For Healthcare) Advance Directive: Patient does not have advance directive;Patient would not like information Pre-existing out of facility DNR order (yellow form or pink MOST form): No Nutrition Screen- [facility] Adult/[facility]/AP Patient's home diet: Regular  Additional Information 1:1 In Past 12 Months?: No CIRT Risk: No Elopement Risk: No Does patient have medical clearance?: Yes     Disposition:  Disposition Initial Assessment Completed for this Encounter: Yes Disposition of Patient: Inpatient treatment program Type of inpatient treatment program: Adult  On Site Evaluation by:  Trixie Dredge  Reviewed with Physician:  Carley Hammed Marlana Latus 01/16/2013 10:43 PM

## 2013-01-16 NOTE — ED Notes (Signed)
Pt requesting detox from opiates, last used today at 1300. Denies SI or HI. No acute distress noted at triage.

## 2013-01-17 ENCOUNTER — Encounter (HOSPITAL_COMMUNITY): Payer: Self-pay | Admitting: *Deleted

## 2013-01-17 ENCOUNTER — Encounter (HOSPITAL_COMMUNITY): Payer: Self-pay | Admitting: Emergency Medicine

## 2013-01-17 ENCOUNTER — Inpatient Hospital Stay (HOSPITAL_COMMUNITY): Admission: AD | Admit: 2013-01-17 | Payer: Self-pay | Source: Intra-hospital | Admitting: Psychiatry

## 2013-01-17 ENCOUNTER — Emergency Department (HOSPITAL_COMMUNITY)
Admission: EM | Admit: 2013-01-17 | Discharge: 2013-01-17 | Discharge status: Against Medical Advice | Payer: Self-pay | Attending: Emergency Medicine | Admitting: Emergency Medicine

## 2013-01-17 DIAGNOSIS — F17200 Nicotine dependence, unspecified, uncomplicated: Secondary | ICD-10-CM | POA: Insufficient documentation

## 2013-01-17 DIAGNOSIS — Z880 Allergy status to penicillin: Secondary | ICD-10-CM | POA: Insufficient documentation

## 2013-01-17 DIAGNOSIS — F1110 Opioid abuse, uncomplicated: Secondary | ICD-10-CM | POA: Insufficient documentation

## 2013-01-17 NOTE — ED Notes (Signed)
Pt requested something to eat and drink. Pt given a Happy Meal and a Caf-free Coke.

## 2013-01-17 NOTE — ED Notes (Signed)
Pt. Back at the desk wanting something to make him sleep. Informed Pt. He could have something again at 0600.

## 2013-01-17 NOTE — BH Assessment (Signed)
[facility] Assessment Progress Note      Referral faxed to [facility]

## 2013-01-17 NOTE — BHH Counselor (Signed)
Pt is provided outpatient opioid treatment information for [place]. Pt reports that he cannot "be away from my children more than a couple days. Pt girlfriend in [place]. Writer and doctor Hyacinth Meeker agreed that outpatient services which provided Suboxone is best for the pt. Denice Bors, AADC 01/17/2013 11:57 AM

## 2013-01-17 NOTE — ED Provider Notes (Signed)
I have had a rather in-depth discussion with the patient is morning regarding the need for inpatient versus outpatient therapy and the fact that he cannot continue to receive benzodiazepines for his opiate withdrawal. He is somewhat dissatisfied with this, he has chosen to pursue outpatient options rather than to stay for inpatient detoxification. He appears clinically stable for discharge at this time.  Vida Roller, MD 01/17/13 1145

## 2013-01-17 NOTE — ED Notes (Signed)
Pt at the desk requesting something to put him to sleep. Informed Pt. We don't give sleep medicine at 0530.

## 2013-01-17 NOTE — ED Notes (Signed)
Pt and girlfriend walked out of ED. Did not wait for discharge papers.

## 2013-01-17 NOTE — ED Notes (Signed)
RN is notified of pt B/P

## 2013-01-17 NOTE — ED Provider Notes (Signed)
Medical screening examination/treatment/procedure(s) were conducted as a shared visit with non-physician practitioner(s) and myself.  I personally evaluated the patient during the encounter  Doug Sou, MD 01/17/13 0139

## 2013-01-17 NOTE — ED Notes (Signed)
Pt requested something to eat and drink. Pt given a Happy Meal, some graham crackers, peanut butter and a caff-free Coke with a cup of ice.

## 2013-01-17 NOTE — ED Provider Notes (Signed)
History    CSN: [id number] Arrival date & time 01/17/13  1300  First MD Initiated Contact with Patient 01/17/13 1334     Chief Complaint  Patient presents with  . Addiction Problem   (Consider location/radiation/quality/duration/timing/severity/associated sxs/prior Treatment) HPI Comments: Pt presents with c/o drug abuse, the patient and his girlfriend state that they have been using opiate medications, they both want to get off of these, they were both in the emergency department less than one hour ago and decided that they wanted to go home and followup as outpatient. They're back now stating that they really can't do it by themselves. At the same time the patient states that he really just wants to be girlfriend and his father and he "I don't know what to do without them". When I told him that he cannot be in the same room as his girlfriend in emergency Department he seems to be upset, he does not seem to be worried about his drug abuse as much as he is worried that he can be in the same room as his girlfriend. He denies depression or suicidal thoughts  The history is provided by the patient.   Past Medical History  Diagnosis Date  . Substance abuse    History reviewed. No pertinent past surgical history. History reviewed. No pertinent family history. History  Substance Use Topics  . Smoking status: Current Every Day Smoker -- 1.00 packs/day    Types: Cigarettes  . Smokeless tobacco: Not on file  . Alcohol Use: Yes     Comment: occ    Review of Systems  All other systems reviewed and are negative.    Allergies  Penicillins  Home Medications  No current outpatient prescriptions on file. BP 123/76  Pulse 96  Temp(Src) 98.2 F (36.8 C) (Oral)  Resp 18  Ht 6' (1.829 m)  Wt 160 lb (72.576 kg)  BMI 21.7 kg/m2  SpO2 98% Physical Exam  Nursing note and vitals reviewed. Constitutional: He appears well-developed and well-nourished. No distress.  HENT:  Head:  Normocephalic and atraumatic.  Mouth/Throat: Oropharynx is clear and moist. No oropharyngeal exudate.  Eyes: Conjunctivae and EOM are normal. Pupils are equal, round, and reactive to light. Right eye exhibits no discharge. Left eye exhibits no discharge. No scleral icterus.  Neck: Normal range of motion. Neck supple. No JVD present. No thyromegaly present.  Cardiovascular: Normal rate, regular rhythm, normal heart sounds and intact distal pulses.  Exam reveals no gallop and no friction rub.   No murmur heard. Pulmonary/Chest: Effort normal and breath sounds normal. No respiratory distress. He has no wheezes. He has no rales.  Abdominal: Soft. Bowel sounds are normal. He exhibits no distension and no mass. There is no tenderness.  Musculoskeletal: Normal range of motion. He exhibits no edema and no tenderness.  Lymphadenopathy:    He has no cervical adenopathy.  Neurological: He is alert. Coordination normal.  Skin: Skin is warm and dry. No rash noted. No erythema.  Psychiatric: He has a normal mood and affect. His behavior is normal.    ED Course  Procedures (including critical care time) Labs Reviewed - No data to display No results found. 1. Opiate abuse, continuous     MDM  The patient's vital signs are normal, there is possibly some secondary gain here, according to the nurse she spoke with the father who stated that the patient's cannot come back to the house was to go to detox. I suspect this has something to  do with their recurrent visit.  The patient has now healed without informing staff, this is further confirmation that he truly does not want help with his opiate abuse at this time.  Vida Roller, MD 01/17/13 [phone]

## 2013-01-17 NOTE — ED Notes (Signed)
Pt here requesting inpatient detox from opiods; pt discharged less than 1 hour ago from [facility]; pt sts last use yesterday am

## 2013-01-17 NOTE — ED Notes (Signed)
Dr. Rennis Chris in room to speak to Pt. Per his request. MD updated on all the prn meds Pt. Has been given. No new orders received.

## 2013-01-17 NOTE — ED Provider Notes (Signed)
Patient wishes detoxification from drugs he uses Percocet and Vicodin. Denies other drug use. Presently complains of feeling restless and unable to sleep patient's alert Glasgow Coma Score 15 no distress  Doug Sou, MD 01/17/13 [phone]

## 2013-01-17 NOTE — ED Notes (Signed)
Within minutes of arriving on the unit the Pt. Started walking down the hallway looking in the rooms. When questioned about what he was doing he stated he was looking for his Girlfriend. Redirected pt. To his room and explained to him he could not look in other Pt's rooms.

## 2013-01-17 NOTE — ED Notes (Signed)
Pt. Was writhing all over the bed stating he could not take it anymore and I had to give him something. While administering him his clonidine he asked me about his Girlfriend that had come in the same time he did for the same reason. He was told that due to HIPPA he could not be given any information about another Pt. In this facility. He wanted to know how he would find out about her. He was told it would be up to her to give him information about herself if she chose to do so. The Pt. Was able to Remain completely still in the bed the whole time we spoke about his GF.

## 2013-05-02 ENCOUNTER — Emergency Department (HOSPITAL_COMMUNITY)
Admission: EM | Admit: 2013-05-02 | Discharge: 2013-05-02 | Disposition: A | Discharge status: Home / Self Care | Payer: Self-pay | Attending: Emergency Medicine | Admitting: Emergency Medicine

## 2013-05-02 DIAGNOSIS — F1123 Opioid dependence with withdrawal: Secondary | ICD-10-CM

## 2013-05-02 DIAGNOSIS — Z79899 Other long term (current) drug therapy: Secondary | ICD-10-CM | POA: Insufficient documentation

## 2013-05-02 DIAGNOSIS — Z880 Allergy status to penicillin: Secondary | ICD-10-CM | POA: Insufficient documentation

## 2013-05-02 DIAGNOSIS — F19939 Other psychoactive substance use, unspecified with withdrawal, unspecified: Secondary | ICD-10-CM | POA: Insufficient documentation

## 2013-05-02 DIAGNOSIS — F17200 Nicotine dependence, unspecified, uncomplicated: Secondary | ICD-10-CM | POA: Insufficient documentation

## 2013-05-02 DIAGNOSIS — R6883 Chills (without fever): Secondary | ICD-10-CM | POA: Insufficient documentation

## 2013-05-02 LAB — RAPID URINE DRUG SCREEN, HOSP PERFORMED
Barbiturates: NOT DETECTED
Benzodiazepines: NOT DETECTED

## 2013-05-02 NOTE — ED Provider Notes (Signed)
CSN: [id number]     Arrival date & time 05/02/13  0102 History   First MD Initiated Contact with Patient 05/02/13 0109     Chief Complaint  Patient presents with  . Medical Clearance   (Consider location/radiation/quality/duration/timing/severity/associated sxs/prior Treatment) Patient is a 32 y.o. male presenting with general illness. The history is provided by the patient.  Illness Location:  Diffuse Quality:  Chills, myalgias Severity:  Mild Onset quality:  Gradual Timing:  Constant Progression:  Worsening Chronicity:  New Context:  Stopped Methadone 4 days ago - has been slowing weaning for past several months. Girlfriend was worried he was in withdrawwal from the methadone Relieved by:  Marlin Canary powders, energy drinks Worsened by:  Nothing Associated symptoms: no cough and no fever     Past Medical History  Diagnosis Date  . Substance abuse    No past surgical history on file. No family history on file. History  Substance Use Topics  . Smoking status: Current Every Day Smoker -- 1.00 packs/day    Types: Cigarettes  . Smokeless tobacco: Not on file  . Alcohol Use: Yes     Comment: occ    Review of Systems  Constitutional: Positive for chills. Negative for fever.  Respiratory: Negative for cough.   All other systems reviewed and are negative.    Allergies  Penicillins  Home Medications   Current Outpatient Rx  Name  Route  Sig  Dispense  Refill  . acetaminophen (TYLENOL) 500 MG tablet   Oral   Take 500 mg by mouth every 6 (six) hours as needed for pain.         . Aspirin-Salicylamide-Caffeine (BC HEADACHE POWDER PO)   Oral   Take 1 Package by mouth 3 (three) times daily as needed (pain).         . methadone (DOLOPHINE) 5 MG/5ML solution   Oral   Take 5 mg by mouth every morning.          BP 137/91  Temp(Src) 98.6 F (37 C) (Oral)  Resp 18  SpO2 100% Physical Exam  Nursing note and vitals reviewed. Constitutional: He is oriented to person,  place, and time. He appears well-developed and well-nourished. No distress.  HENT:  Head: Normocephalic and atraumatic.  Mouth/Throat: No oropharyngeal exudate.  Eyes: EOM are normal. Pupils are equal, round, and reactive to light.  Neck: Normal range of motion. Neck supple.  Cardiovascular: Normal rate and regular rhythm.  Exam reveals no friction rub.   No murmur heard. Pulmonary/Chest: Effort normal and breath sounds normal. No respiratory distress. He has no wheezes. He has no rales.  Abdominal: He exhibits no distension. There is no tenderness. There is no rebound.  Musculoskeletal: Normal range of motion. He exhibits no edema.  Neurological: He is alert and oriented to person, place, and time. No cranial nerve deficit. He exhibits normal muscle tone. Coordination normal.  Skin: No rash noted. He is not diaphoretic.    ED Course  Procedures (including critical care time) Labs Review Labs Reviewed  URINE RAPID DRUG SCREEN (HOSP PERFORMED) - Abnormal; Notable for the following:    Tetrahydrocannabinol POSITIVE (*)    All other components within normal limits   Imaging Review No results found.  EKG Interpretation   None       MDM   1. Chills   2. Narcotic withdrawal    50M here because girlfriend called EMS because she was worried about him coming down off the methadone. Patient stopped taking  methadone 4 days ago. 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 He was started 3 months ago on methadone for drug addiction. He states he's had some alcohol, Klonopin, energy drinks, he comes to help with the symptoms. Symptoms include chills, body aches, he states "it feels like a flu 20 times over". He denies any fevers. Denies any suicidal or homicidal ideation. He states he is clean doesn't need detox as he use drugs anymore. Triage notes stated he wants a Psych consult, however when I asked him about this, he doesn't want one. I do not see any reason to hold him  here. He will finish his 500cc bolus of NS and be discharged.     Dagmar Hait, MD 05/02/13 [phone]

## 2013-05-02 NOTE — ED Notes (Signed)
Pt arrives by EMS/has been off Methadone for 4 days.  The past 4 days-self medicating with alcohol, klonopin, energy drinks and goody powders-has had 8 energy drinks tonight.  Pt told EMS he wants a psych consult.  Addiction problem since he was 18.

## 2013-05-02 NOTE — ED Notes (Signed)
Bed: WA20 Expected date:  Expected time:  Means of arrival:  Comments: EMS/withdrawal from [facility] medical clearance

## 2013-12-10 ENCOUNTER — Encounter (HOSPITAL_COMMUNITY): Payer: Self-pay | Admitting: Emergency Medicine

## 2013-12-10 ENCOUNTER — Emergency Department (HOSPITAL_COMMUNITY)
Admission: EM | Admit: 2013-12-10 | Discharge: 2013-12-10 | Disposition: A | Discharge status: Home / Self Care | Payer: Worker's Compensation | Attending: Emergency Medicine | Admitting: Emergency Medicine

## 2013-12-10 ENCOUNTER — Emergency Department (HOSPITAL_COMMUNITY): Payer: Worker's Compensation

## 2013-12-10 DIAGNOSIS — IMO0002 Reserved for concepts with insufficient information to code with codable children: Secondary | ICD-10-CM | POA: Insufficient documentation

## 2013-12-10 DIAGNOSIS — F17200 Nicotine dependence, unspecified, uncomplicated: Secondary | ICD-10-CM | POA: Insufficient documentation

## 2013-12-10 DIAGNOSIS — Y9389 Activity, other specified: Secondary | ICD-10-CM | POA: Insufficient documentation

## 2013-12-10 DIAGNOSIS — M549 Dorsalgia, unspecified: Secondary | ICD-10-CM

## 2013-12-10 DIAGNOSIS — Y990 Civilian activity done for income or pay: Secondary | ICD-10-CM | POA: Insufficient documentation

## 2013-12-10 DIAGNOSIS — Y9289 Other specified places as the place of occurrence of the external cause: Secondary | ICD-10-CM | POA: Insufficient documentation

## 2013-12-10 DIAGNOSIS — X500XXA Overexertion from strenuous movement or load, initial encounter: Secondary | ICD-10-CM | POA: Insufficient documentation

## 2013-12-10 DIAGNOSIS — Z880 Allergy status to penicillin: Secondary | ICD-10-CM | POA: Insufficient documentation

## 2013-12-10 MED ORDER — IBUPROFEN 600 MG PO TABS: 600.0000 mg | ORAL_TABLET | Freq: Four times a day (QID) | ORAL | Status: DC

## 2013-12-10 MED ORDER — DIAZEPAM 5 MG PO TABS
5.0000 mg | ORAL_TABLET | Freq: Once | ORAL | Status: AC
  Administered 2013-12-10: 5 mg via ORAL
  Filled 2013-12-10: qty 1

## 2013-12-10 MED ORDER — KETOROLAC TROMETHAMINE 30 MG/ML IJ SOLN
30.0000 mg | Freq: Once | INTRAMUSCULAR | Status: AC
  Administered 2013-12-10: 30 mg via INTRAVENOUS
  Filled 2013-12-10: qty 1

## 2013-12-10 MED ORDER — HYDROMORPHONE HCL PF 1 MG/ML IJ SOLN
1.0000 mg | INTRAMUSCULAR | Status: DC | PRN
  Administered 2013-12-10: 1 mg via INTRAVENOUS
  Filled 2013-12-10: qty 1

## 2013-12-10 MED ORDER — DIAZEPAM 5 MG PO TABS: 5.0000 mg | ORAL_TABLET | Freq: Three times a day (TID) | ORAL | Status: DC | PRN

## 2013-12-10 MED ORDER — OXYCODONE-ACETAMINOPHEN 5-325 MG PO TABS: 1.0000 | ORAL_TABLET | ORAL | Status: DC | PRN

## 2013-12-10 NOTE — ED Notes (Signed)
Patient transported to X-ray 

## 2013-12-10 NOTE — ED Provider Notes (Signed)
I saw and evaluated the patient, reviewed the resident's note and I agree with the findings and plan.   EKG Interpretation None      Likely musculoskeletal back pain.  Normal strength in his lower extremities.  Patient feels better after management in the emergency department.  Discharge home with PCP followup.  Home with a short course of pain medicine as well as muscle relaxants anti-inflammatories.  He understands to return to the ER for new or worsening symptoms.  Lyanne Co, MD 12/10/13 [phone]

## 2013-12-10 NOTE — Discharge Instructions (Signed)
Starting tomorrow, take the ibuprofen every six hours (even if you are feeling okay). Do this for a few days. It will help control the inflammation of your back. Take diazepam as needed for muscle spasm, and oxycodone-acetaminophen as needed for severe pain.  Follow up with a doctor prior to resuming work to be cleared for work. Your workers comp people should be able to help with this.   Lumbosacral Strain Lumbosacral strain is a strain of any of the parts that make up your lumbosacral vertebrae. Your lumbosacral vertebrae are the bones that make up the lower third of your backbone. Your lumbosacral vertebrae are held together by muscles and tough, fibrous tissue (ligaments).  CAUSES  A sudden blow to your back can cause lumbosacral strain. Also, anything that causes an excessive stretch of the muscles in the low back can cause this strain. This is typically seen when people exert themselves strenuously, fall, lift heavy objects, bend, or crouch repeatedly. RISK FACTORS  Physically demanding work.  Participation in pushing or pulling sports or sports that require sudden twist of the back (tennis, golf, baseball).  Weight lifting.  Excessive lower back curvature.  Forward-tilted pelvis.  Weak back or abdominal muscles or both.  Tight hamstrings. SIGNS AND SYMPTOMS  Lumbosacral strain may cause pain in the area of your injury or pain that moves (radiates) down your leg.  DIAGNOSIS Your health care provider can often diagnose lumbosacral strain through a physical exam. In some cases, you may need tests such as X-ray exams.  TREATMENT  Treatment for your lower back injury depends on many factors that your clinician will have to evaluate. However, most treatment will include the use of anti-inflammatory medicines. HOME CARE INSTRUCTIONS   Avoid hard physical activities (tennis, racquetball, waterskiing) if you are not in proper physical condition for it. This may aggravate or create  problems.  If you have a back problem, avoid sports requiring sudden body movements. Swimming and walking are generally safer activities.  Maintain good posture.  Maintain a healthy weight.  For acute conditions, you may put ice on the injured area.  Put ice in a plastic bag.  Place a towel between your skin and the bag.  Leave the ice on for 20 minutes, 2 3 times a day.  When the low back starts healing, stretching and strengthening exercises may be recommended. SEEK MEDICAL CARE IF:  Your back pain is getting worse.  You experience severe back pain not relieved with medicines. SEEK IMMEDIATE MEDICAL CARE IF:   You have numbness, tingling, weakness, or problems with the use of your arms or legs.  There is a change in bowel or bladder control.  You have increasing pain in any area of the body, including your belly (abdomen).  You notice shortness of breath, dizziness, or feel faint.  You feel sick to your stomach (nauseous), are throwing up (vomiting), or become sweaty.  You notice discoloration of your toes or legs, or your feet get very cold. MAKE SURE YOU:   Understand these instructions.  Will watch your condition.  Will get help right away if you are not doing well or get worse. Document Released: 03/28/2005 Document Revised: 04/08/2013 Document Reviewed: 02/04/2013 [facility] Patient Information 2014 [place], [place].    Emergency Department Resource Guide 1) Find a Doctor and Pay Out of Pocket Although you won't have to find out who is covered by your insurance plan, it is a good idea to ask around and get recommendations. You will then  need to call the office and see if the doctor you have chosen will accept you as a new patient and what types of options they offer for patients who are self-pay. Some doctors offer discounts or will set up payment plans for their patients who do not have insurance, but you will need to ask so you aren't surprised when you get to  your appointment.  2) Contact Your Local Health Department Not all health departments have doctors that can see patients for sick visits, but many do, so it is worth a call to see if yours does. If you don't know where your local health department is, you can check in your phone book. The CDC also has a tool to help you locate your state's health department, and many state websites also have listings of all of their local health departments.  3) Find a Walk-in Clinic If your illness is not likely to be very severe or complicated, you may want to try a walk in clinic. These are popping up all over the country in pharmacies, drugstores, and shopping centers. They're usually staffed by nurse practitioners or physician assistants that have been trained to treat common illnesses and complaints. They're usually fairly quick and inexpensive. However, if you have serious medical issues or chronic medical problems, these are probably not your best option.  No Primary Care Doctor: - Call Health Connect at  [phone] - they can help you locate a primary care doctor that  accepts your insurance, provides certain services, etc. - Physician Referral Service- [phone]  Chronic Pain Problems: Organization         Address  Phone   Notes  [facility]  [phone] Patients need to be referred by their primary care doctor.   Medication Assistance: Organization         Address  Phone   Notes  [facility] Medication [facility] [address] [place], [place] [address] [phone] --Must be a resident of [facility] -- Must have NO insurance coverage whatsoever (no Medicaid/ Medicare, etc.) -- The pt. MUST have a primary care doctor that directs their care regularly and follows them in the community   [facility]ist  [phone]   [employer]  [phone]    Agencies that provide inexpensive medical care: Organization         Address  Phone    Notes  [facility]  [phone]   [facility]    [phone]   [facility] [address] [place], [place] [address] [phone]   [facility] [address] [place] [phone]   Planned Parenthood    [phone]   [facility]    [phone]   [facility] and [facility]  [address], [place] Phone:  [phone], Fax:  [phone] Hours of Operation:  9 am - 6 pm, M-F.  Also accepts Medicaid/Medicare and self-pay.  [facility]  [address], [place] Phone: [phone], Fax: [phone]. Hours of Operation:  8:30 am - 5:30 pm, M-F.  Also accepts Medicaid and self-pay.  [facility] [place] [address] [place] Phone: [phone]   [facility] [address] Natasha Bence [place], [place] [phone], Ext. [phone] Mondays & Thursdays: 7-9 AM.  First 15 patients are seen on a first come, first serve basis.  Medicaid-accepting [facility] Providers:  Organization         Address  Phone   Notes  [facility] [address], [place] [phone] Also accepts self-pay patients.  [facility] [address] Laurell Josephs [place], [place]  [phone]   [facility] [address], [place] [phone]   [facility] [address] [place] [phone]   Renaye Rakers [address], [place]   [phone] Only accepts [place] Access [place] patients after they have their name applied to their card.   Self-Pay (no insurance) in [facility]:  Organization         Address  Phone   Notes  Sickle Cell Patients, [facility] [address] [place], [place] [phone]   [facility] [address], [place] [phone]   [facility]  [address], [place] [phone]   [facility]/Dr. Osei-Bonsu  [address], [place] or [address], [place] [phone] Phone number for both [place] and [place] locations is the same.  [facility] [address], [place] [phone]   [facility] [address] [place] or [address] [phone]   [facility] [address] [place] [phone], phone; [phone], fax Sees patients 1st and 3rd Saturday of every month.  Must not qualify for public or private insurance (i.e. Medicaid, Medicare, [place] Health Choice, Veterans' Benefits)  Household income should be no more than 200% of the poverty level The clinic cannot treat you if you are pregnant or think you are pregnant  Sexually transmitted diseases are not treated at the clinic.    Dental Care: Organization         Address  Phone  Notes  [facility] Department of [facility] [address] [place], [place] [phone] Accepts children up to age 37 who are enrolled in [place] or [place] Health Choice; pregnant women with a Medicaid card; and children who have applied for Medicaid or [place] Health Choice, but were declined, whose parents can pay a reduced fee at time of service.  [facility] of [facility]  [address], [place] [phone] Accepts children up to age 40 who are enrolled in [place] or [place] Health Choice; pregnant women with a Medicaid card; and children who have applied for Medicaid or [place] Health Choice, but were declined, whose parents can pay a reduced fee at time of service.  [facility]  [address] [place], [place] [phone] Patients are seen by appointment only. Walk-ins are not accepted. [facility] will see patients 52 years of age  and older. Monday - Tuesday (8am-5pm) Most Wednesdays (8:30-5pm) $30 per visit, cash only  [facility] Adult Dental Access PRO[facility]  [address], [facility] [phone] Patients are seen by appointment only. Walk-ins are not accepted. [facility] will see patients 71 years of age and older. One Wednesday Evening (Monthly: Volunteer Based).  $30 per visit, cash only  [employer]  [phone] for adults; Children under age 17, call Graduate Pediatric Dentistry at [phone]. Children aged 48-14, please call [phone] to request a  pediatric application.  Dental services are provided in all areas of dental care including fillings, crowns and bridges, complete and partial dentures, implants, gum treatment, root canals, and extractions. Preventive care is also provided. Treatment is provided to both adults and children. Patients are selected via a lottery and there is often a waiting list.   [facility] [address], [place]  [phone] [email or web address]   [facility] [address] [place], [place] [phone], Ext. [phone] Second and Fourth Thursday of each month, opens at 6:30 AM; Clinic ends at 9 AM.  Patients are seen on a first-come first-served basis, and a limited number are seen during each clinic.   [facility]  [address] Ether Griffins [place], [place] [phone]   Eligibility Requirements You must have lived in [place], [place], or [place] counties for at least the last three months.   You cannot be eligible for state or federal sponsored [place], including [employer], [place], or [employer].   You generally cannot be eligible for healthcare insurance through your employer.    How to apply: Eligibility screenings are held every Tuesday and Wednesday afternoon from 1:00 pm until 4:00 pm. You do not need an appointment for the interview!  [facility] [address], [place], [place] [phone]   [facility] Health Department  [phone]   [facility] Health Department  [phone]   [facility] Health Department  [phone]    Behavioral Health Resources in the Community: Intensive Outpatient Programs Organization         Address  Phone  Notes  [facility] [address] [place], [place] [phone]   [facility] Outpatient [address], [place], [place] [phone]   [facility] [address], [place], [place]  [phone]   [facility] Mental Health [address]  [place], [place] [phone] or [phone]   Substance Abuse Resources Organization         Address  Phone  Notes  [facility]  [phone]   [facility]  [phone]   [facility]  [phone]   Floydene Flock  [phone]   Residential & Outpatient Substance Abuse Program  [phone]   Psychological Services Organization         Address  Phone  Notes  [facility]al Health  [phone]   [facility]  [phone]   [facility] Mental Health [address], [place] [phone] or [phone]    Mobile Crisis Teams Organization         Address  Phone  Notes  [facility], Mobile Crisis Care Unit  [phone]   [facility]  [address] [place], [place] [phone]   Doristine Locks [address] [place] [phone]    Self-Help/Support Groups Organization         Address  Phone             Notes  [facility] [place] - variety of support groups  336- I7437963 Call for more information  Narcotics Anonymous (NA), [facility] [address], [employer] [place]  2 meetings at this location   Chief Executive Officer  Notes  [facility] [address],    [place]  [phone]   [facility]  [address], [place]  [address], [place], [place] [phone]   °[facility] [address], [place] [phone] Admissions: 8am-3pm M-F  °[facility] [address]    °[place], [place] [phone]   °[facility] [address], [place], [place] [phone]   °[facility] [address]  °[place], [place] [phone]   °[facility] - Intensive Outpatient [address], [place], [place] [phone]   °[facility] [address]  °[place], [place] [phone] or [phone]   °[facility] ([facility]) [address] [place], [place] [phone] Accepts Medicaid  °[facility] [address]  °[place] 1-800-659-3381 Substance Abuse/Addiction Treatment  ° °[place] Behavioral Health Resources °Organization         Address  Phone  Notes  °[facility]  ([phone]) [phone]   °Julie Brannon, PhD [address] [place], [place]   ([phone]) [phone] or (336) 951-0000   °[facility]   [address] °[place], [place] (336) 349-4454   °[facility] [address], [place], [place] ([phone]) [phone] Insurance/Medicaid/sponsorship through [facility]  °[facility] [address]                                    [place], [place] [phone]) [phone] Therapy/tele-psych/case  °[facility] [address]  ° [place], [place] ([phone]) [phone]    °Dr. Arfeen  ([phone]) [phone]   °[facility] [place]  [employer] [place] [facility] 1) [address], [place] °2) [address], [place] °3)  [address], [place] ([phone]) [phone] °(336) [phone] ° °(336) [phone]   °[place] Child Abuse Hotline (336) 342-1394 or (336) 342-3537 (After Hours)    ° ° ° °

## 2013-12-10 NOTE — ED Notes (Signed)
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 Pt states pain 10/10

## 2013-12-10 NOTE — ED Provider Notes (Signed)
CSN: [id number]     Arrival date & time 12/10/13  1132 History   First MD Initiated Contact with Patient 12/10/13 1157     Chief Complaint  Patient presents with  . 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 He states that he felt something pop in his low back and had immediate pain. He was brought to the ER via EMS. He continues to have severe low back pain. He is able to move his legs. Has not had any incontinence of bowel or bladder. 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 He is accompanied by his girlfriend and father today.  Past Medical History  Diagnosis Date  . Substance abuse    History reviewed. No pertinent past surgical history. History reviewed. No pertinent family history. History  Substance Use Topics  . Smoking status: Current Every Day Smoker -- 1.00 packs/day    Types: Cigarettes  . Smokeless tobacco: Not on file  . Alcohol Use: Yes     Comment: occ    Review of Systems  Genitourinary: Negative for difficulty urinating.  Musculoskeletal: Positive for back pain. Negative for neck pain.    Allergies  Penicillins  Home Medications   Prior to Admission medications   Medication Sig Start Date End Date Taking? Authorizing Provider  Aspirin-Salicylamide-Caffeine (BC HEADACHE POWDER PO) Take 1 Package by mouth 3 (three) times daily as needed (pain).   Yes Historical Provider, MD   BP 138/94  Temp(Src) 98 F (36.7 C) (Oral)  Resp 13  SpO2 98% Physical Exam  Constitutional: He is oriented to person, place, and time. He appears well-developed and well-nourished. He appears distressed.  HENT:  Head: Normocephalic and atraumatic.  Mouth/Throat: Oropharynx is clear and moist.  Eyes: EOM are  normal. Pupils are equal, round, and reactive to light.  Neck: Normal range of motion. Neck supple.  No posterior cervical bony tenderness  Cardiovascular: Normal rate, regular rhythm and normal heart sounds.   Pulmonary/Chest: Effort normal and breath sounds normal.  Abdominal: Soft. Bowel sounds are normal. He exhibits no distension. There is no tenderness. There is no rebound and no guarding.  Musculoskeletal:       Lumbar back: He exhibits tenderness, pain and spasm. He exhibits no edema and no deformity.  Neurological: He is alert and oriented to person, place, and time. He has normal strength.  No lower extremity weakness  Skin: Skin is warm and dry. He is not diaphoretic.  Psychiatric: He has a normal mood and affect.     ED Course  Procedures (including critical care time) Labs Review Labs Reviewed - No data to display  Imaging Review Dg Lumbar Spine Complete  12/10/2013   CLINICAL DATA:  Back injury. Lower lumbar pain. Limited range of motion.  EXAM: LUMBAR SPINE - COMPLETE 4+ VIEW  COMPARISON:  CT 08/02/2012.  FINDINGS: Bilateral L5 pars defects are present. Grade I anterolisthesis measures 4 mm. This appears similar to the prior CT. Mild L4-L5 degenerative disc disease with disc space narrowing and endplate spurring. Vertebral body height is preserved. Mild dextroconvex curve may be positional or secondary to spasm.  IMPRESSION: No acute osseous abnormality. 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  Electronically Signed   By: Andreas NewportGeoffrey  Lamke M.D.   On: 12/10/2013 13:50     EKG Interpretation None      MDM   Final diagnoses:  Back pain   33 year old male presenting with acute onset of low back pain, after injury during lifting heavy items at work. X-ray is negative for any acute fractures. This likely represents severe lumbar strain but could be due to disc herniation. Patient will be given a limited  quantity of narcotic pain medication, a benzodiazepine for spasm relief, and ibuprofen. Patient and family are on board with receiving controlled substances and a limited quantity given history of substance abuse. Patient was instructed to followup with an outpatient physician, which should be available to him via workers comp, prior to returning to work.  Levert FeinsteinBrittany Adorian Gwynne, MD Family Medicine PGY-2     Latrelle DodrillBrittany J Hiroto Saltzman, MD 12/10/13 [phone]

## 2013-12-29 IMAGING — CT CT ABD-PELV W/O CM
1 of 2 series · 15 of 32 positions shown, 19 images · non-contrast
Comparison: CT abdomen pelvis - 12/08/2005

CLINICAL DATA: Trouble urinating, frequent urination, lower
abdominal pain and pressure

CT ABDOMEN AND PELVIS WITHOUT CONTRAST
TECHNIQUE: Multidetector CT imaging of the abdomen and pelvis was
performed following the standard protocol without intravenous
contrast.

[Series 3: stone 160 5.0 b31f st · axial · 0.74mm/px · z∈[-607,-212]mm · 15 of 87 slices shown, 19 images]
[im 4/87  soft-tissue]
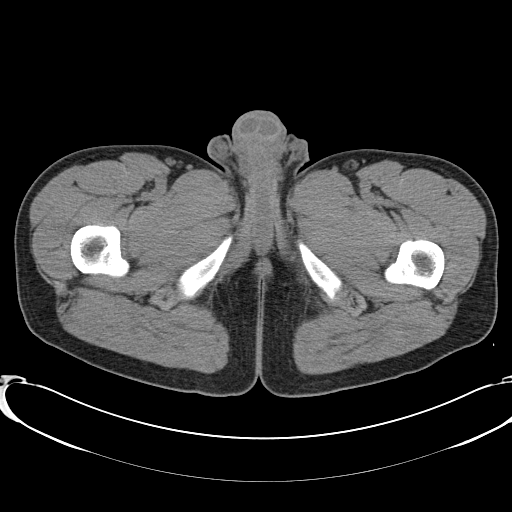
[im 4/87  bone]
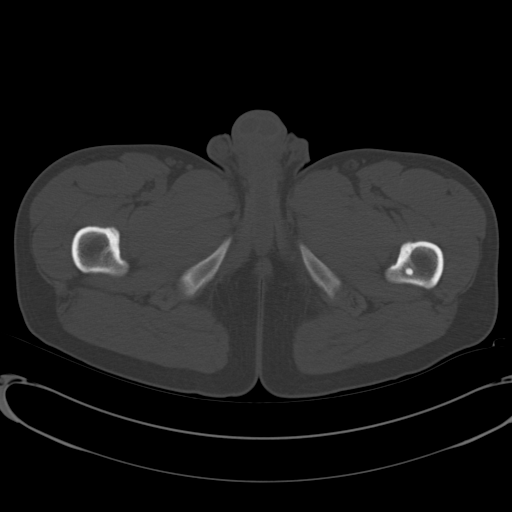
[im 11/87  soft-tissue]
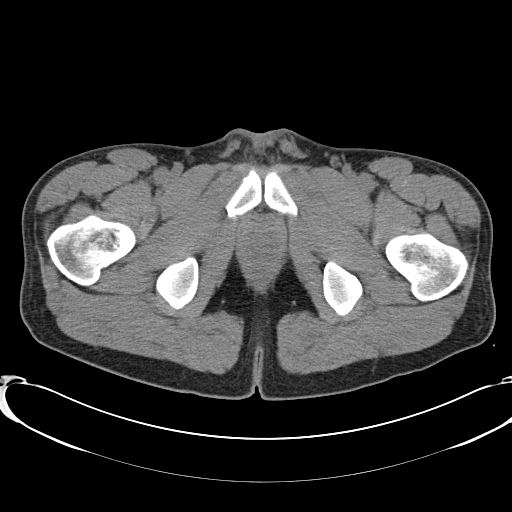
[im 18/87  soft-tissue]
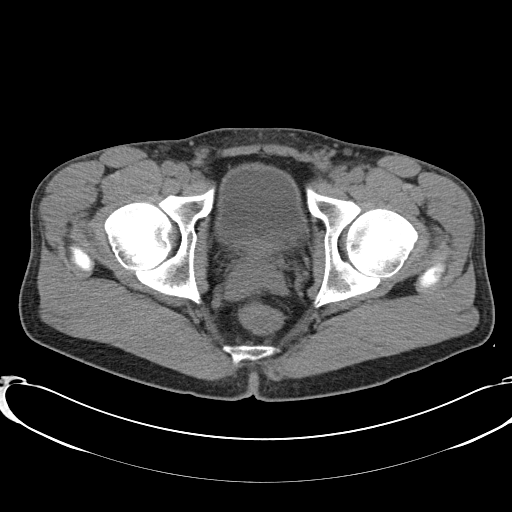
[im 26/87  soft-tissue]
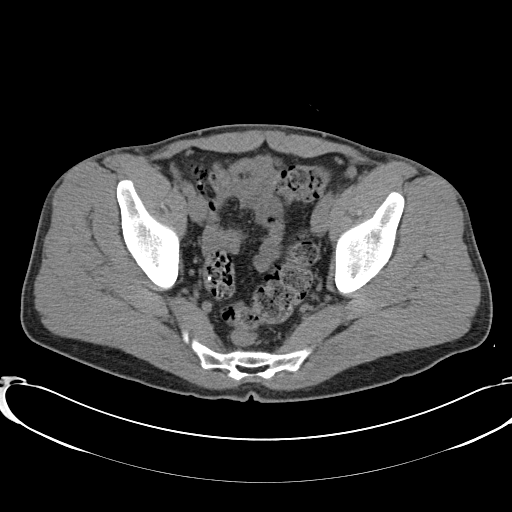
[im 29/87  soft-tissue]
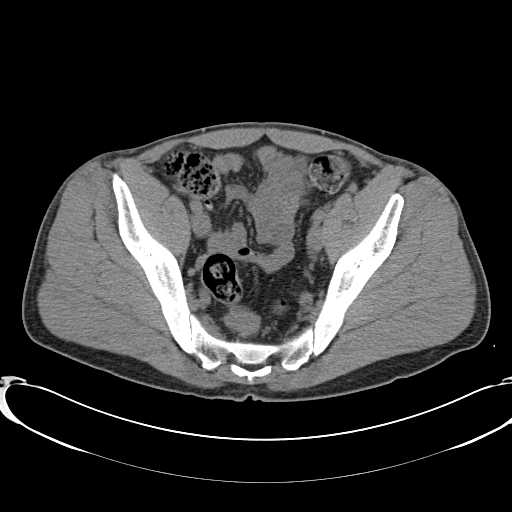
[im 36/87  soft-tissue]
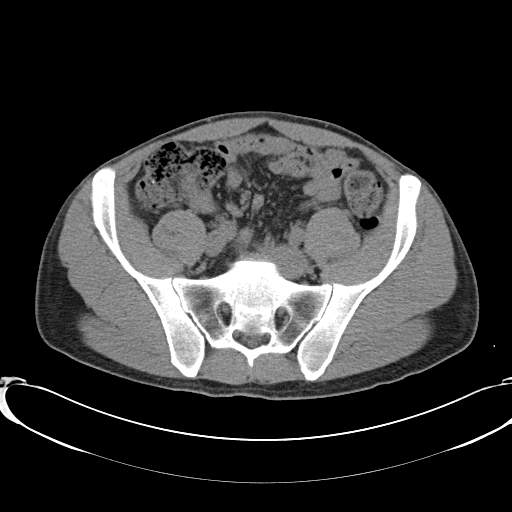
[im 44/87  soft-tissue]
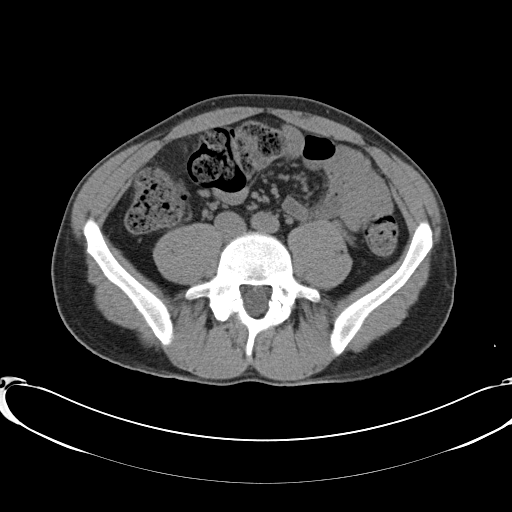
[im 51/87  soft-tissue]
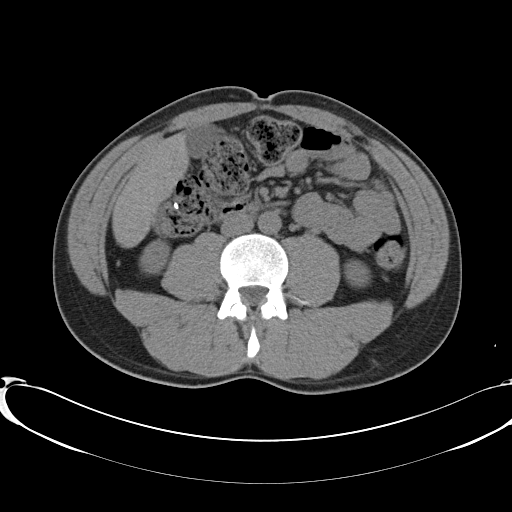
[im 58/87  soft-tissue]
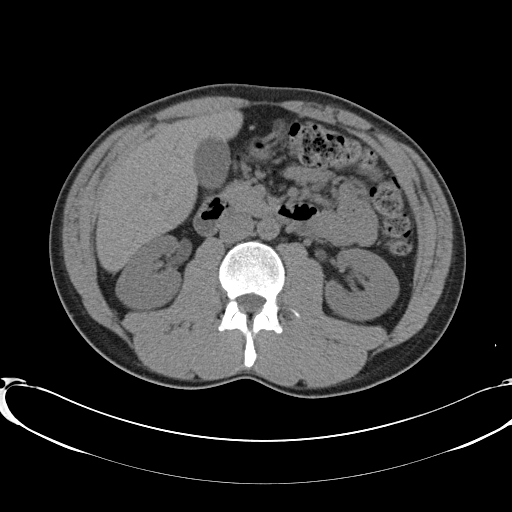
[im 58/87  bone]
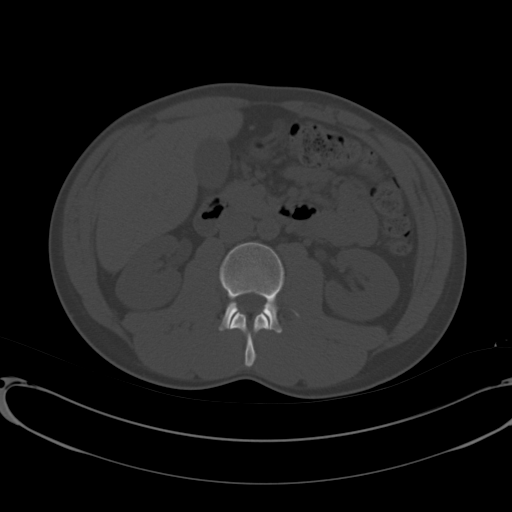
[im 61/87  soft-tissue]
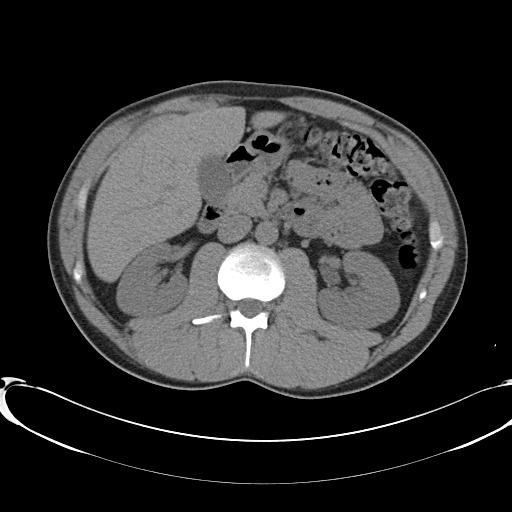
[im 69/87  soft-tissue]
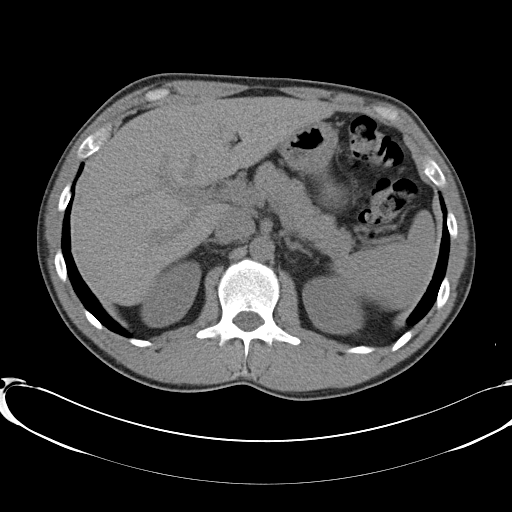
[im 72/87  lung]
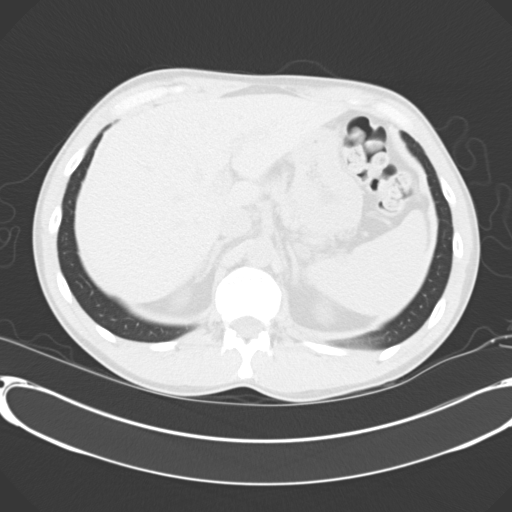
[im 76/87  soft-tissue]
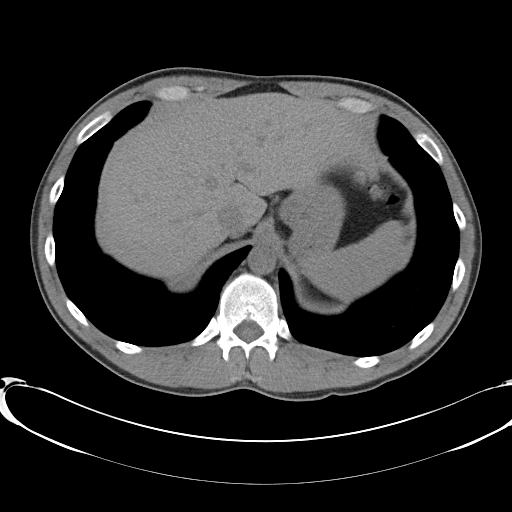
[im 76/87  lung]
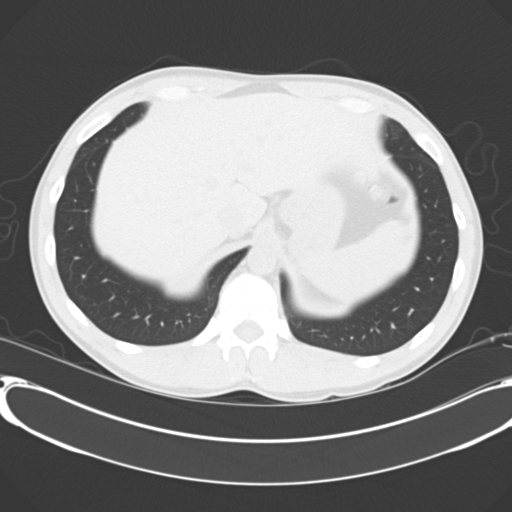
[im 79/87  lung]
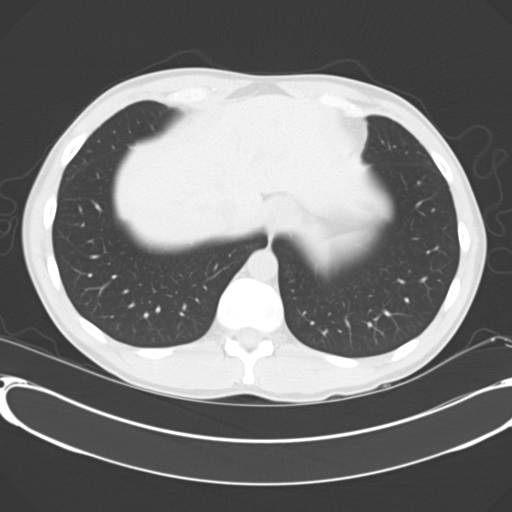
[im 83/87  soft-tissue]
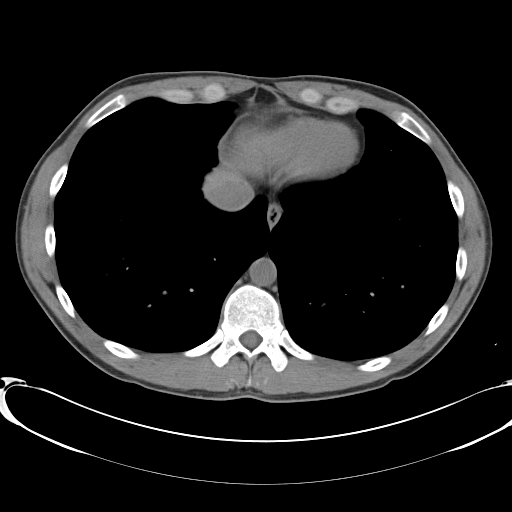
[im 83/87  lung]
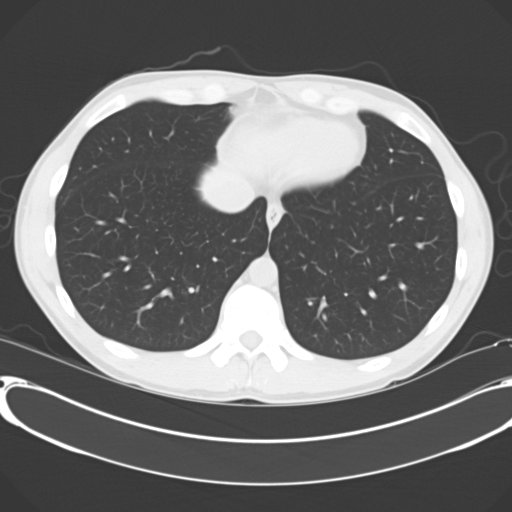

[15 of 32 positions shown; findings below may reference images not displayed]

FINDINGS: The lack of intravenous contrast limits the ability to evaluate
solid abdominal organs.

Normal noncontrast appearance of the bilateral kidneys.  No renal
stones.  No stones are seen within the expected location of either
ureter or the urinary bladder.  The urinary bladder is normal in
degree of distension.  No urine obstruction or perinephric
stranding.  Minimal prostatic calcifications.  No free fluid in the
pelvis.

Normal noncontrast appearance of the bilateral adrenal glands,
pancreas and spleen.  Incidental note is made of a small splenule.

Normal hepatic contour.  Normal noncontrast appearance of the
gallbladder.  No ascites.

Moderate to large colonic stool burden without evidence of
obstruction.  The bowel is otherwise normal in course and caliber
without wall thickening.  Normal noncontrast appearance of the
appendix.  No pneumoperitoneum, pneumatosis or portal venous gas.

Normal caliber of the abdominal aorta.  No definite
retroperitoneal, mesenteric, pelvic or inguinal lymphadenopathy on
this noncontrast examination.

 Limited visualization of the lower thorax demonstrates an
approximately 3 mm nodule within the left lower lobe (image six,
series 4) favored to be sequela of prior infection or inflammation.
No focal airspace opacity.  No pleural effusion.

Normal heart size.  No pericardial effusion.

No acute or aggressive osseous abnormalities.  Bilateral L5 pars
defects with associated minimal (approximately 4 mm) of
anterolisthesis of L5 upon S1.
IMPRESSION: 1.  No explanation for patient's dysuria.  Specifically, no
evidence of nephrolithiasis urinary obstruction.

2.  Bilateral L5 pars defects with associated minimal
(approximately 4 mm) of anterolisthesis of L5 upon S1.

## 2014-10-02 ENCOUNTER — Emergency Department (HOSPITAL_COMMUNITY): Payer: 59

## 2014-10-02 ENCOUNTER — Encounter (HOSPITAL_COMMUNITY): Payer: Self-pay | Admitting: *Deleted

## 2014-10-02 ENCOUNTER — Emergency Department (HOSPITAL_COMMUNITY)
Admission: EM | Admit: 2014-10-02 | Discharge: 2014-10-02 | Disposition: A | Discharge status: Home / Self Care | Payer: 59 | Attending: Emergency Medicine | Admitting: Emergency Medicine

## 2014-10-02 DIAGNOSIS — J069 Acute upper respiratory infection, unspecified: Secondary | ICD-10-CM | POA: Diagnosis not present

## 2014-10-02 DIAGNOSIS — R05 Cough: Secondary | ICD-10-CM | POA: Diagnosis present

## 2014-10-02 DIAGNOSIS — Z720 Tobacco use: Secondary | ICD-10-CM | POA: Insufficient documentation

## 2014-10-02 DIAGNOSIS — Z880 Allergy status to penicillin: Secondary | ICD-10-CM | POA: Insufficient documentation

## 2014-10-02 MED ORDER — HYDROCOD POLST-CHLORPHEN POLST 10-8 MG/5ML PO LQCR: 5.0000 mL | Freq: Two times a day (BID) | ORAL | Status: DC | PRN

## 2014-10-02 NOTE — ED Notes (Signed)
Pt has multiple complaints. Reports productive cough for extended amount of time, with yellow mucus and fever. Has bodyaches, congestion, difficulty hearing.no acute distress noted at triage.

## 2014-10-02 NOTE — Care Management (Cosign Needed)
ED CM received call from patient concerning his insurance not covering prescription received for cough suppressant. CM explained that most insurance do not cover cough suppressants.  Advised patient if he is unable to afford to speak with the pharmacist for an over-the-counter recommendation, patient agreeable. No further ED CM needs identified.

## 2014-10-02 NOTE — Discharge Instructions (Signed)
Cough, Adult  A cough is a reflex that helps clear your throat and airways. It can help heal the body or may be a reaction to an irritated airway. A cough may only last 2 or 3 weeks (acute) or may last more than 8 weeks (chronic).  CAUSES Acute cough:  Viral or bacterial infections. Chronic cough:  Infections.  Allergies.  Asthma.  Post-nasal drip.  Smoking.  Heartburn or acid reflux.  Some medicines.  Chronic lung problems (COPD).  Cancer. SYMPTOMS   Cough.  Fever.  Chest pain.  Increased breathing rate.  High-pitched whistling sound when breathing (wheezing).  Colored mucus that you cough up (sputum). TREATMENT   A bacterial cough may be treated with antibiotic medicine.  A viral cough must run its course and will not respond to antibiotics.  Your caregiver may recommend other treatments if you have a chronic cough. HOME CARE INSTRUCTIONS   Only take over-the-counter or prescription medicines for pain, discomfort, or fever as directed by your caregiver. Use cough suppressants only as directed by your caregiver.  Use a cold steam vaporizer or humidifier in your bedroom or home to help loosen secretions.  Sleep in a semi-upright position if your cough is worse at night.  Rest as needed.  Stop smoking if you smoke. SEEK IMMEDIATE MEDICAL CARE IF:   You have pus in your sputum.  Your cough starts to worsen.  You cannot control your cough with suppressants and are losing sleep.  You begin coughing up blood.  You have difficulty breathing.  You develop pain which is getting worse or is uncontrolled with medicine.  You have a fever. MAKE SURE YOU:   Understand these instructions.  Will watch your condition.  Will get help right away if you are not doing well or get worse. Document Released: 12/15/2010 Document Revised: 09/10/2011 Document Reviewed: 12/15/2010 Exit[employer] Patient Information 2015 [employer]. This information is not intended  to replace advice given to you by your health care provider. Make sure you discuss any questions you have with your health care provider.  

## 2014-10-02 NOTE — ED Provider Notes (Signed)
CSN: [id number]     Arrival date & time 10/02/14  0753 History   First MD Initiated Contact with Patient 10/02/14 [phone]     Chief Complaint  Patient presents with  . Cough  . Fever     (Consider location/radiation/quality/duration/timing/severity/associated sxs/prior Treatment) Patient is a 34 y.o. male presenting with cough.  Cough Cough characteristics:  Productive Sputum characteristics:  Green Severity:  Moderate Onset quality:  Gradual Duration:  2 weeks Timing:  Constant Progression:  Worsening Chronicity:  New Smoker: yes   Context: upper respiratory infection   Relieved by:  Nothing Worsened by:  Deep breathing Ineffective treatments:  None tried Associated symptoms: shortness of breath (when laying down) and sinus congestion   Associated symptoms: no chest pain     Past Medical History  Diagnosis Date  . Substance abuse    History reviewed. No pertinent past surgical history. History reviewed. No pertinent family history. History  Substance Use Topics  . Smoking status: Current Every Day Smoker -- 1.00 packs/day    Types: Cigarettes  . Smokeless tobacco: Not on file  . Alcohol Use: Yes     Comment: occ    Review of Systems  Respiratory: Positive for cough and shortness of breath (when laying down).   Cardiovascular: Negative for chest pain.  All other systems reviewed and are negative.     Allergies  Penicillins  Home Medications   Prior to Admission medications   Medication Sig Start Date End Date Taking? Authorizing Provider  acetaminophen (TYLENOL) 500 MG tablet Take 500 mg by mouth every 6 (six) hours as needed.   Yes Historical Provider, MD  Aspirin-Salicylamide-Caffeine (BC HEADACHE POWDER PO) Take 1 Package by mouth 3 (three) times daily as needed (pain).   Yes Historical Provider, MD  Nutritional Supplements (COLD AND FLU PO) Take 1 tablet by mouth every 6 (six) hours as needed (cold symptoms).   Yes Historical Provider, MD   chlorpheniramine-HYDROcodone (TUSSIONEX PENNKINETIC ER) 10-8 MG/5ML LQCR Take 5 mLs by mouth every 12 (twelve) hours as needed for cough. 10/02/14   Mirian Mo, MD   BP 117/85 mmHg  Pulse 99  Temp(Src) 98 F (36.7 C) (Oral)  Resp 16  Ht  (1.803 m)  Wt 175 lb (79.379 kg)  BMI 24.42 kg/m2  SpO2 97% Physical Exam  Constitutional: He is oriented to person, place, and time. He appears well-developed and well-nourished.  HENT:  Head: Normocephalic and atraumatic.  Eyes: Conjunctivae and EOM are normal.  Neck: Normal range of motion. Neck supple.  Cardiovascular: Normal rate, regular rhythm and normal heart sounds.   Pulmonary/Chest: Effort normal and breath sounds normal. No respiratory distress.  Abdominal: He exhibits no distension. There is no tenderness. There is no rebound and no guarding.  Musculoskeletal: Normal range of motion.  Neurological: He is alert and oriented to person, place, and time.  Skin: Skin is warm and dry.  Vitals reviewed.   ED Course  Procedures (including critical care time) Labs Review Labs Reviewed - No data to display  Imaging Review Dg Chest 2 View  10/02/2014   CLINICAL DATA:  Cough and fever for 2 weeks.  Coughing up mucus.  EXAM: CHEST  2 VIEW  COMPARISON:  10/14/2004  FINDINGS: The heart size and mediastinal contours are within normal limits. Both lungs are clear. The visualized skeletal structures are unremarkable.  IMPRESSION: No active cardiopulmonary disease.   Electronically Signed   By: Richarda Overlie M.D.   On: 10/02/2014 09:48  EKG Interpretation None      MDM   Final diagnoses:  Upper respiratory infection    34 y.o. male without pertinent PMH presents with cough, congestion, subjective fever 2 weeks. He presents today as his symptoms worsened over the last 24 hours. On arrival today the patient has vital signs physical exam as above. No chest pain, and no dyspnea with exertion.  Benign exam. He has had sick contacts with  family members with pneumonia.    Wu unremarkable.  Likely uri.  DC home in stable condition.    I have reviewed all laboratory and imaging studies if ordered as above  1. Upper respiratory infection         Mirian MoMatthew Gentry, MD 10/02/14 [phone]

## 2015-12-31 ENCOUNTER — Emergency Department (HOSPITAL_COMMUNITY)
Admission: EM | Admit: 2015-12-31 | Discharge: 2015-12-31 | Disposition: A | Discharge status: Home / Self Care | Payer: Self-pay | Attending: Emergency Medicine | Admitting: Emergency Medicine

## 2015-12-31 ENCOUNTER — Encounter (HOSPITAL_COMMUNITY): Payer: Self-pay

## 2015-12-31 DIAGNOSIS — F17210 Nicotine dependence, cigarettes, uncomplicated: Secondary | ICD-10-CM | POA: Insufficient documentation

## 2015-12-31 DIAGNOSIS — Z7982 Long term (current) use of aspirin: Secondary | ICD-10-CM | POA: Insufficient documentation

## 2015-12-31 DIAGNOSIS — F410 Panic disorder [episodic paroxysmal anxiety] without agoraphobia: Secondary | ICD-10-CM | POA: Insufficient documentation

## 2015-12-31 LAB — CBG MONITORING, ED: GLUCOSE-CAPILLARY: 102 mg/dL — AB (ref 65–99)

## 2015-12-31 MED ORDER — ONDANSETRON HCL 4 MG/2ML IJ SOLN
4.0000 mg | Freq: Once | INTRAMUSCULAR | Status: AC
  Administered 2015-12-31: 4 mg via INTRAVENOUS
  Filled 2015-12-31: qty 2

## 2015-12-31 NOTE — ED Notes (Signed)
Pt ambulated to bathroom without difficulty.

## 2015-12-31 NOTE — Discharge Instructions (Signed)

## 2015-12-31 NOTE — ED Notes (Signed)
Per EMS- Pt had been drinking for 6 hours. Started throwing up, anxiety attack, left arm tingling. Sx subsided since ride over in ambulance. Hx of panic attacks.

## 2015-12-31 NOTE — ED Notes (Signed)
Bed: WA06 Expected date:  Expected time:  Means of arrival:  Comments: [age] M ETOH

## 2015-12-31 NOTE — ED Provider Notes (Signed)
CSN: [id number]     Arrival date & time 12/31/15  0213 History   First MD Initiated Contact with Patient 12/31/15 [phone]     Chief Complaint  Patient presents with  . Alcohol Intoxication     (Consider location/radiation/quality/duration/timing/severity/associated sxs/prior Treatment) HPI Comments: 35 year old male with a history of substance abuse presents to the emergency department for evaluation of symptoms consistent with a panic attack. Patient reports that he was drinking alcohol for the past 6 hours. He states that he was mixing beer and liquor. He states that he began throwing up this evening after which time he felt as though it was hard for him to breathe. He states that he was breathing faster than normal and developed paresthesias in his left arm which concerned him. Friend called EMS and symptoms subsided on the way to the emergency department. Patient does have a history of panic attacks. No medications given by EMS prior to arrival. Patient states that he is feeling better. He denies any complaints of numbness or paresthesias at this time. He had no associated syncope. No abdominal pain or recent fevers.  Patient is a 35 y.o. male presenting with intoxication. The history is provided by the patient. No language interpreter was used.  Alcohol Intoxication Associated symptoms include nausea and vomiting. Pertinent negatives include no abdominal pain, fever, numbness or weakness.    Past Medical History  Diagnosis Date  . Substance abuse    History reviewed. No pertinent past surgical history. No family history on file. Social History  Substance Use Topics  . Smoking status: Current Every Day Smoker -- 1.00 packs/day    Types: Cigarettes  . Smokeless tobacco: None  . Alcohol Use: Yes     Comment: occ    Review of Systems  Constitutional: Negative for fever.  Gastrointestinal: Positive for nausea and vomiting. Negative for abdominal pain.  Neurological: Negative for  weakness and numbness.       +paresthesias LUE  Psychiatric/Behavioral: The patient is nervous/anxious.   All other systems reviewed and are negative.   Allergies  Penicillins  Home Medications   Prior to Admission medications   Medication Sig Start Date End Date Taking? Authorizing Provider  acetaminophen (TYLENOL) 500 MG tablet Take 500 mg by mouth every 6 (six) hours as needed (for pain.).    Yes Historical Provider, MD  Aspirin-Salicylamide-Caffeine (BC HEADACHE POWDER PO) Take 1 Package by mouth 3 (three) times daily as needed (pain).   Yes Historical Provider, MD   BP 108/69 mmHg  Pulse 72  Temp(Src) 97.9 F (36.6 C) (Oral)  Resp 19  SpO2 97%   Physical Exam  Constitutional: He is oriented to person, place, and time. He appears well-developed and well-nourished. No distress.  Nontoxic appearing and in no distress  HENT:  Head: Normocephalic and atraumatic.  Eyes: Conjunctivae and EOM are normal. No scleral icterus.  Neck: Normal range of motion.  Cardiovascular: Normal rate, regular rhythm and intact distal pulses.   Pulmonary/Chest: Effort normal and breath sounds normal. No respiratory distress. He has no wheezes. He has no rales.  Respirations even and unlabored  Abdominal: Soft. He exhibits no distension. There is no tenderness. There is no rebound and no guarding.  Soft, nontender  Musculoskeletal: Normal range of motion.  Neurological: He is alert and oriented to person, place, and time. He exhibits normal muscle tone. Coordination normal.  GCS 15. No focal deficits appreciated.  Skin: Skin is warm and dry. No rash noted. He is not diaphoretic.  No erythema. No pallor.  Psychiatric: He has a normal mood and affect. His behavior is normal.  Nursing note and vitals reviewed.   ED Course  Procedures (including critical care time) Labs Review Labs Reviewed  CBG MONITORING, ED - Abnormal; Notable for the following:    Glucose-Capillary 102 (*)    All other  components within normal limits    Imaging Review No results found.   I have personally reviewed and evaluated these images and lab results as part of my medical decision-making.   EKG Interpretation None      MDM   Final diagnoses:  Panic attack    35 year old male presents to the emergency department for evaluation of symptoms consistent with a panic attack which occurred in the setting of alcohol intoxication. Patient feels better since arriving in the ED. He has no complaints. Vital stable and patient is clinically sober. He ambulates without difficulty or assistance. Patient given antiemetic given history of nausea and vomiting prior to arrival. He has no complaints of abdominal pain.  No indication for further emergent workup at this time. Patient discharged in satisfactory condition. Return precautions provided. Patient agreeable to plan with no unaddressed concerns.   Filed Vitals:   12/31/15 0214 12/31/15 0224 12/31/15 0332  BP:  124/83 108/69  Pulse:  90 72  Temp:  97.9 F (36.6 C)   TempSrc:  Oral   Resp:  19 19  SpO2: 96% 99% 97%     Antony MaduraKelly Bren Steers, PA-C 12/31/15 0630  Rolland PorterMark James, MD 01/13/16 1240

## 2018-05-18 ENCOUNTER — Encounter (HOSPITAL_COMMUNITY): Payer: Self-pay | Admitting: Behavioral Health

## 2018-05-18 ENCOUNTER — Emergency Department (HOSPITAL_COMMUNITY)
Admission: EM | Admit: 2018-05-18 | Discharge: 2018-05-19 | Disposition: A | Discharge status: Other Acute Inpatient Hospital | Payer: Self-pay | Attending: Emergency Medicine | Admitting: Emergency Medicine

## 2018-05-18 DIAGNOSIS — F10229 Alcohol dependence with intoxication, unspecified: Secondary | ICD-10-CM

## 2018-05-18 DIAGNOSIS — F332 Major depressive disorder, recurrent severe without psychotic features: Secondary | ICD-10-CM | POA: Diagnosis present

## 2018-05-18 DIAGNOSIS — F17210 Nicotine dependence, cigarettes, uncomplicated: Secondary | ICD-10-CM | POA: Insufficient documentation

## 2018-05-18 DIAGNOSIS — R45851 Suicidal ideations: Secondary | ICD-10-CM | POA: Insufficient documentation

## 2018-05-18 DIAGNOSIS — F1010 Alcohol abuse, uncomplicated: Secondary | ICD-10-CM

## 2018-05-18 LAB — ETHANOL: Alcohol, Ethyl (B): 199 mg/dL — ABNORMAL HIGH (ref <10)

## 2018-05-18 LAB — COMPREHENSIVE METABOLIC PANEL
ALBUMIN: 4.6 g/dL (ref 3.5–5.0)
ALK PHOS: 54 U/L (ref 38–126)
ALT: 22 U/L (ref 0–44)
AST: 21 U/L (ref 15–41)
Anion gap: 10 (ref 5–15)
BUN: 8 mg/dL (ref 6–20)
CALCIUM: 9.1 mg/dL (ref 8.9–10.3)
CHLORIDE: 110 mmol/L (ref 98–111)
CO2: 26 mmol/L (ref 22–32)
Creatinine, Ser: 0.79 mg/dL (ref 0.61–1.24)
GFR calc non Af Amer: >60 mL/min (ref >60)
GLUCOSE: 95 mg/dL (ref 70–99)
POTASSIUM: 3.9 mmol/L (ref 3.5–5.1)
Sodium: 146 mmol/L — ABNORMAL HIGH (ref 135–145)
Total Bilirubin: 0.4 mg/dL (ref 0.3–1.2)
Total Protein: 7.8 g/dL (ref 6.5–8.1)

## 2018-05-18 LAB — ACETAMINOPHEN LEVEL
Acetaminophen (Tylenol), Serum: <10 ug/mL — ABNORMAL LOW (ref 10–30)
Acetaminophen (Tylenol), Serum: <10 ug/mL — ABNORMAL LOW (ref 10–30)

## 2018-05-18 LAB — CBC
HEMATOCRIT: 42.1 % (ref 39.0–52.0)
HEMOGLOBIN: 14.2 g/dL (ref 13.0–17.0)
MCH: 32.9 pg (ref 26.0–34.0)
MCHC: 33.7 g/dL (ref 30.0–36.0)
MCV: 97.5 fL (ref 80.0–100.0)
Platelets: 379 10*3/uL (ref 150–400)
RBC: 4.32 MIL/uL (ref 4.22–5.81)
RDW: 12.2 % (ref 11.5–15.5)
WBC: 10.7 10*3/uL — ABNORMAL HIGH (ref 4.0–10.5)
nRBC: 0 % (ref 0.0–0.2)

## 2018-05-18 LAB — RAPID URINE DRUG SCREEN, HOSP PERFORMED
AMPHETAMINES: NOT DETECTED
BARBITURATES: NOT DETECTED
Benzodiazepines: NOT DETECTED
Cocaine: POSITIVE — AB
Opiates: NOT DETECTED
TETRAHYDROCANNABINOL: POSITIVE — AB

## 2018-05-18 LAB — CBG MONITORING, ED: Glucose-Capillary: 102 mg/dL — ABNORMAL HIGH (ref 70–99)

## 2018-05-18 LAB — SALICYLATE LEVEL: Salicylate Lvl: <7 mg/dL (ref 2.8–30.0)

## 2018-05-18 MED ORDER — LORAZEPAM 2 MG/ML IJ SOLN
0.0000 mg | Freq: Four times a day (QID) | INTRAMUSCULAR | Status: DC
  Administered 2018-05-18: 2 mg via INTRAMUSCULAR
  Filled 2018-05-18: qty 1

## 2018-05-18 MED ORDER — LORAZEPAM 1 MG PO TABS: 0.0000 mg | ORAL_TABLET | Freq: Four times a day (QID) | ORAL | Status: DC

## 2018-05-18 MED ORDER — ONDANSETRON 8 MG PO TBDP
8.0000 mg | ORAL_TABLET | Freq: Once | ORAL | Status: AC
  Administered 2018-05-18: 8 mg via ORAL
  Filled 2018-05-18: qty 1

## 2018-05-18 MED ORDER — LORAZEPAM 2 MG/ML IJ SOLN
0.0000 mg | Freq: Four times a day (QID) | INTRAMUSCULAR | Status: DC
  Administered 2018-05-18: 1 mg via INTRAVENOUS
  Filled 2018-05-18: qty 1

## 2018-05-18 MED ORDER — VITAMIN B-1 100 MG PO TABS
100.0000 mg | ORAL_TABLET | Freq: Every day | ORAL | Status: DC
  Administered 2018-05-19: 100 mg via ORAL
  Filled 2018-05-18: qty 1

## 2018-05-18 MED ORDER — FLUOXETINE HCL 20 MG PO CAPS
20.0000 mg | ORAL_CAPSULE | Freq: Every day | ORAL | Status: DC
  Administered 2018-05-18 – 2018-05-19 (×2): 20 mg via ORAL
  Filled 2018-05-18 (×2): qty 1

## 2018-05-18 MED ORDER — SODIUM CHLORIDE 0.9 % IV BOLUS
1000.0000 mL | Freq: Once | INTRAVENOUS | Status: AC
  Administered 2018-05-18: 1000 mL via INTRAVENOUS

## 2018-05-18 MED ORDER — LORAZEPAM 1 MG PO TABS
0.0000 mg | ORAL_TABLET | Freq: Four times a day (QID) | ORAL | Status: DC
  Administered 2018-05-18: 2 mg via ORAL
  Administered 2018-05-19: 1 mg via ORAL
  Filled 2018-05-18: qty 2
  Filled 2018-05-18: qty 1

## 2018-05-18 MED ORDER — LORAZEPAM 2 MG/ML IJ SOLN: 0.0000 mg | Freq: Two times a day (BID) | INTRAMUSCULAR | Status: DC

## 2018-05-18 MED ORDER — LORAZEPAM 1 MG PO TABS: 0.0000 mg | ORAL_TABLET | Freq: Two times a day (BID) | ORAL | Status: DC

## 2018-05-18 MED ORDER — THIAMINE HCL 100 MG/ML IJ SOLN
100.0000 mg | Freq: Every day | INTRAMUSCULAR | Status: DC
  Administered 2018-05-18: 100 mg via INTRAVENOUS
  Filled 2018-05-18: qty 2

## 2018-05-18 MED ORDER — ONDANSETRON HCL 4 MG PO TABS: 4.0000 mg | ORAL_TABLET | Freq: Three times a day (TID) | ORAL | Status: DC | PRN

## 2018-05-18 MED ORDER — ACETAMINOPHEN 325 MG PO TABS
650.0000 mg | ORAL_TABLET | ORAL | Status: DC | PRN
  Administered 2018-05-18 (×2): 650 mg via ORAL
  Filled 2018-05-18 (×2): qty 2

## 2018-05-18 MED ORDER — LORAZEPAM 2 MG/ML IJ SOLN
1.0000 mg | Freq: Once | INTRAMUSCULAR | Status: AC
  Administered 2018-05-18: 1 mg via INTRAVENOUS
  Filled 2018-05-18: qty 1

## 2018-05-18 MED ORDER — ALUM & MAG HYDROXIDE-SIMETH 200-200-20 MG/5ML PO SUSP: 30.0000 mL | Freq: Four times a day (QID) | ORAL | Status: DC | PRN

## 2018-05-18 NOTE — ED Provider Notes (Signed)
[facility]-EMERGENCY DEPT Provider Note   CSN: [id number] Arrival date & time: 05/18/18  0705     History   Chief Complaint Chief Complaint  Patient presents with  . Suicidal    HPI Reginald Patton is a 37 y.o. male.  HPI 37 year old male with history of substance abuse here with reported suicidal ideation.  Patient intoxicated and agitated on arrival, limiting history.  He states that he was "just drunk" last night.  However, per EMS, they were called to the scene after patient reportedly was drinking all night and wrote a note stating he wanted to "end it all."  He also reportedly took multiple Tylenol and went into the woods.  Girlfriend and friend were worried about his safety so they called EMS.  Patient denying any suicidal ideation at this time, there is tearful and agitated as well as intoxicated.  Level 5 caveat invoked as remainder of history, ROS, and physical exam limited due to patient's alcohol intoxication, agitation.   Past Medical History:  Diagnosis Date  . Substance abuse     There are no active problems to display for this patient.   No past surgical history on file.      Home Medications    Prior to Admission medications   Medication Sig Start Date End Date Taking? Authorizing Provider  acetaminophen (TYLENOL) 500 MG tablet Take 500 mg by mouth every 6 (six) hours as needed (for pain.).    Yes [provider]  Aspirin-Salicylamide-Caffeine (BC HEADACHE POWDER PO) Take 1 Package by mouth 3 (three) times daily as needed (pain).   Yes [provider]    Family History No family history on file.  Social History Social History   Tobacco Use  . Smoking status: Current Every Day Smoker    Packs/day: 1.00    Types: Cigarettes  Substance Use Topics  . Alcohol use: Yes    Comment: occ  . Drug use: Yes     Allergies   Penicillins   Review of Systems Review of Systems  Unable to perform ROS: Mental  status change  Psychiatric/Behavioral: Positive for dysphoric mood.     Physical Exam Updated Vital Signs BP (!) 138/94   Pulse (!) 115   Temp 99 F (37.2 C) (Oral)   Resp 14   Ht 6' (1.829 m)   Wt 81.6 kg   SpO2 99%   BMI 24.41 kg/m   Physical Exam  Constitutional: He is oriented to person, place, and time. He appears well-developed and well-nourished. He appears distressed.  HENT:  Head: Normocephalic and atraumatic.  Eyes: Conjunctivae are normal.  Neck: Neck supple.  Cardiovascular: Normal rate, regular rhythm and normal heart sounds. Exam reveals no friction rub.  No murmur heard. Pulmonary/Chest: Effort normal and breath sounds normal. No respiratory distress. He has no wheezes. He has no rales.  Abdominal: He exhibits no distension.  Musculoskeletal: He exhibits no edema.  Neurological: He is alert and oriented to person, place, and time. He exhibits normal muscle tone.  Skin: Skin is warm. Capillary refill takes less than 2 seconds.  Psychiatric: His affect is angry. His speech is slurred. He is agitated. Cognition and memory are impaired. He expresses impulsivity.  Nursing note and vitals reviewed.    ED Treatments / Results  Labs (all labs ordered are listed, but only abnormal results are displayed) Labs Reviewed  COMPREHENSIVE METABOLIC PANEL - Abnormal; Notable for the following components:      Result Value  Sodium 146 (*)    All other components within normal limits  ETHANOL - Abnormal; Notable for the following components:   Alcohol, Ethyl (B) 199 (*)    All other components within normal limits  ACETAMINOPHEN LEVEL - Abnormal; Notable for the following components:   Acetaminophen (Tylenol), Serum <10 (*)    All other components within normal limits  CBC - Abnormal; Notable for the following components:   WBC 10.7 (*)    All other components within normal limits  RAPID URINE DRUG SCREEN, HOSP PERFORMED - Abnormal; Notable for the following  components:   Cocaine POSITIVE (*)    Tetrahydrocannabinol POSITIVE (*)    All other components within normal limits  CBG MONITORING, ED - Abnormal; Notable for the following components:   Glucose-Capillary 102 (*)    All other components within normal limits  SALICYLATE LEVEL    EKG EKG Interpretation  Date/Time:  Sunday May 18 2018 07:31:28 EST Ventricular Rate:  101 PR Interval:    QRS Duration: 96 QT Interval:  348 QTC Calculation: 452 R Axis:   36 Text Interpretation:  Sinus tachycardia No significant change since last tracing Confirmed by Calum Cormier ([id number]) on 05/18/2018 8:30:13 AM   Radiology No results found.  Procedures Procedures (including critical care time)  Medications Ordered in ED Medications  sodium chloride 0.9 % bolus 1,000 mL (1,000 mLs Intravenous New Bag/Given 05/18/18 0806)  LORazepam (ATIVAN) injection 0-4 mg (has no administration in time range)    Or  LORazepam (ATIVAN) tablet 0-4 mg (has no administration in time range)  LORazepam (ATIVAN) injection 0-4 mg (has no administration in time range)    Or  LORazepam (ATIVAN) tablet 0-4 mg (has no administration in time range)  thiamine (VITAMIN B-1) tablet 100 mg (has no administration in time range)    Or  thiamine (B-1) injection 100 mg (has no administration in time range)  acetaminophen (TYLENOL) tablet 650 mg (has no administration in time range)  ondansetron (ZOFRAN) tablet 4 mg (has no administration in time range)  alum & mag hydroxide-simeth (MAALOX/MYLANTA) 200-200-20 MG/5ML suspension 30 mL (has no administration in time range)     Initial Impression / Assessment and Plan / ED Course  I have reviewed the triage vital signs and the nursing notes.  Pertinent labs & imaging results that were available during my care of the patient were reviewed by me and considered in my medical decision making (see chart for details).     37  yo M here with suicidal ideation, intoxication.  Suspect this is mostly substance-induced mood d/o but given note found on scene, ? Of tylenol intake, and active intoxication, IVC placed as pt agitated and trying to leave ED, with high risk of danger to self. Will check APAP level, labs.  APAP level neg, doubt significant toxicity. LFts normal. EtOH 199. Will allow to sober, c/s TTS. Pt otherwise medically stable. IVC placed by myself.   Final Clinical Impressions(s) / ED Diagnoses   Final diagnoses:  Suicidal ideation  Alcohol abuse    ED Discharge Orders    None       Shaune Pollack, MD 05/18/18 [phone]

## 2018-05-18 NOTE — Progress Notes (Signed)
Patient admission needs to be delayed until 05/19/18 after there are [facility] [address] [facility].  Patient with whom this patient was to have roomed with is not able to tolerate a roommate.  Patient will be reassessed tomorrow and will be admitted, if he still meets criteria once a bed is available.  Timmothy EulerJean T. Kaylyn LimSutter, MSW, LCSWA Disposition Clinical Social Work [phone] (cell) [phone] (office)

## 2018-05-18 NOTE — ED Notes (Signed)
Patient actively vomiting at bedside.

## 2018-05-18 NOTE — Progress Notes (Signed)
[facility]  Bed: 403-2   Patient may arrive at 9:30pm  RN Call for report: [phone]  Reginald Cutterharlotte Tyria Springer, LCSW-A Clinical Social Worker [phone]

## 2018-05-18 NOTE — ED Notes (Addendum)
Name/Age/Gender Reginald Patton 37 y.o. male  Chief Complaint suicidal  Medical History Past Medical History:  Diagnosis Date  . Substance abuse     Allergies Allergies  Allergen Reactions  . Penicillins Other (See Comments)    Childhood allergy; Unknown reaction  Has patient had a PCN reaction causing immediate rash, facial/tongue/throat swelling, SOB or lightheadedness with hypotension:unsure Has patient had a PCN reaction causing severe rash involving mucus membranes or skin necrosis:unsure Has patient had a PCN reaction that required hospitalization:unsure Has patient had a PCN reaction occurring within the last 10 years:unsure If all of the above answers are "NO", then may proceed with Cephalosporin use.     Labs/Imaging Results for orders placed or performed during the hospital encounter of 05/18/18 (from the past 48 hour(s))  Comprehensive metabolic panel     Status: Abnormal   Collection Time: 05/18/18  7:22 AM  Result Value Ref Range   Sodium 146 (H) 135 - 145 mmol/L   Potassium 3.9 3.5 - 5.1 mmol/L   Chloride 110 98 - 111 mmol/L   CO2 26 22 - 32 mmol/L   Glucose, Bld 95 70 - 99 mg/dL   BUN 8 6 - 20 mg/dL   Creatinine, Ser 0.79 0.61 - 1.24 mg/dL   Calcium 9.1 8.9 - 10.3 mg/dL   Total Protein 7.8 6.5 - 8.1 g/dL   Albumin 4.6 3.5 - 5.0 g/dL   AST 21 15 - 41 U/L   ALT 22 0 - 44 U/L   Alkaline Phosphatase 54 38 - 126 U/L   Total Bilirubin 0.4 0.3 - 1.2 mg/dL   GFR calc non Af Amer >60 >60 mL/min   GFR calc Af Amer >60 >60 mL/min    Comment: (NOTE) The eGFR has been calculated using the CKD EPI equation. This calculation has not been validated in all clinical situations. eGFR's persistently <60 mL/min signify possible Chronic Kidney Disease.    Anion gap 10 5 - 15    Comment: Performed at [facility], [place] [address] [place], [place] [address]  Ethanol     Status: Abnormal   Collection Time: 05/18/18  7:22 AM  Result Value Ref Range   Alcohol, Ethyl (B) 199 (H) <10 mg/dL    Comment: (NOTE) Lowest detectable limit for serum alcohol is 10 mg/dL. For medical purposes only. Performed at [facility], [place] [address] [place], [place] [address]   Salicylate level     Status: None   Collection Time: 05/18/18  7:22 AM  Result Value Ref Range   Salicylate Lvl <6.2 2.8 - 30.0 mg/dL    Comment: Performed at [facility], [place] [address] [place], [place] [address]  Acetaminophen level     Status: Abnormal   Collection Time: 05/18/18  7:22 AM  Result Value Ref Range   Acetaminophen (Tylenol), Serum <10 (L) 10 - 30 ug/mL    Comment: (NOTE) Therapeutic concentrations vary significantly. A range of 10-30 ug/mL  may be an effective concentration for many patients. However, some  are best treated at concentrations outside of this range. Acetaminophen concentrations >150 ug/mL at 4 hours after ingestion  and >50 ug/mL at 12 hours after ingestion are often associated with  toxic reactions. Performed at [facility], [place] [address], [place], [place] [address]   cbc     Status: Abnormal   Collection Time: 05/18/18  7:22 AM  Result Value Ref Range   WBC 10.7 (H) 4.0 - 10.5  K/uL   RBC 4.32 4.22 - 5.81 MIL/uL   Hemoglobin 14.2 13.0 - 17.0 g/dL   HCT 42.1 39.0 - 52.0 %   MCV 97.5 80.0 - 100.0 fL   MCH 32.9 26.0 - 34.0 pg   MCHC 33.7 30.0 - 36.0 g/dL   RDW 12.2 11.5 - 15.5 %   Platelets 379 150 - 400 K/uL   nRBC 0.0 0.0 - 0.2 %    Comment: Performed at [facility], [place] [address] [place], [place] [address]  Rapid urine drug screen (hospital performed)     Status: Abnormal   Collection Time: 05/18/18  7:22 AM  Result Value Ref Range   Opiates NONE DETECTED NONE DETECTED   Cocaine POSITIVE (A) NONE DETECTED   Benzodiazepines NONE DETECTED NONE DETECTED   Amphetamines NONE DETECTED NONE DETECTED   Tetrahydrocannabinol POSITIVE (A) NONE DETECTED    Barbiturates NONE DETECTED NONE DETECTED    Comment: (NOTE) DRUG SCREEN FOR MEDICAL PURPOSES ONLY.  IF CONFIRMATION IS NEEDED FOR ANY PURPOSE, NOTIFY LAB WITHIN 5 DAYS. LOWEST DETECTABLE LIMITS FOR URINE DRUG SCREEN Drug Class                     Cutoff (ng/mL) Amphetamine and metabolites    1000 Barbiturate and metabolites    200 Benzodiazepine                 826 Tricyclics and metabolites     300 Opiates and metabolites        300 Cocaine and metabolites        300 THC                            50 Performed at [facility], [place] [address], [place], [place] [address]   CBG monitoring, ED     Status: Abnormal   Collection Time: 05/18/18  8:04 AM  Result Value Ref Range   Glucose-Capillary 102 (H) 70 - 99 mg/dL   No results found.  Pending Labs Unresulted Labs (From admission, onward)   None      EKG Results EKG Interpretation  Date/Time:  Sunday May 18 2018 07:31:28 EST Ventricular Rate:  101 PR Interval:    QRS Duration: 96 QT Interval:  348 QTC Calculation: 452 R Axis:   36 Text Interpretation:  Sinus tachycardia No significant change since last tracing Confirmed by Duffy Bruce [phone]) on 05/18/2018 8:30:13 AM   Vitals/Pain Today's Vitals   05/18/18 0715 05/18/18 0719 05/18/18 0720  BP:  (!) 140/96   Pulse:  (!) 107   Resp:  16   Temp:  99 F (37.2 C)   TempSrc:  Oral   SpO2: 96% 96%   Weight:   81.6 kg  Height:   6' (1.829 m)  PainSc:   0-No pain     Medications Medications  sodium chloride 0.9 % bolus 1,000 mL (1,000 mLs Intravenous New Bag/Given 05/18/18 0806)    Fax Number [phone]  Recommendations from poison control: Spoke to [place] at [place] control. Keep on monitor. Get tylenol level and repeat 2 hours after initial. Because benadryl on board there can be delyayed gastri motility. Worse case scenario are seizures, treat with benzo or phenobarb. Supportive care.

## 2018-05-18 NOTE — ED Notes (Signed)
Nurse Gabriel Earingaquita brings him from our secure unit, telling he pt. Is in DT's. She initiates IV therapy and gives him meds as ordered.
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
## 2018-05-18 NOTE — ED Provider Notes (Signed)
Patient feeling nauseous, anxious.  Unable to hold down p.o. and would like an IV.  Given IV Ativan.  No signs of alcohol withdrawal.   Virgina NorfolkCuratolo, Amiracle Neises, DO 05/18/18 1750

## 2018-05-18 NOTE — ED Notes (Signed)
Spoke to patient. Patient denies SI or intent to harm self. Patient states he only took a couple tylenol. Patient speaks about not wanting to be "locked up".

## 2018-05-18 NOTE — Progress Notes (Signed)
[facility]. Per ED psychiatrist, the patient meets criteria to be IVC'd.  CSW completed demographic information for affidavit and petition and first opinion. ED Psychiatrist has completed inpatient psychiatric criteria for IVC paperwork, IVC requires a physician to sign the health screening component.   CSW following for disposition.   Enid Cutterharlotte Tevita Gomer, LCSW-A Clinical Social Worker [phone]

## 2018-05-18 NOTE — ED Notes (Signed)
Patient moved to 41. Report called. Belongings and IVC papers sent with.

## 2018-05-18 NOTE — ED Notes (Signed)
Patient actively vomiting, shaking, and reports "im going to pass out". Patient out in hallway stumbling. Dr Venida Jarvisurotolo called x4.

## 2018-05-18 NOTE — ED Notes (Signed)
Patient currently sleeping

## 2018-05-18 NOTE — ED Notes (Signed)
[facility] be [facility] until tomorrow
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
## 2018-05-18 NOTE — ED Notes (Signed)
Patient currently sleeping. Initially asking to leave.

## 2018-05-18 NOTE — Progress Notes (Signed)
This patient continues to meet inpatient criteria. CSW faxed information to the following facilities:   [facility]  Enid Cutterharlotte Lecretia Buczek, LouisianaLCSW-A Clinical Social Worker [phone]

## 2018-05-19 ENCOUNTER — Other Ambulatory Visit: Payer: Self-pay

## 2018-05-19 ENCOUNTER — Encounter (HOSPITAL_COMMUNITY): Payer: Self-pay | Admitting: *Deleted

## 2018-05-19 ENCOUNTER — Inpatient Hospital Stay (HOSPITAL_COMMUNITY)
Admission: AD | Admit: 2018-05-19 | Payer: Federal, State, Local not specified - Other | Source: Intra-hospital | Admitting: Psychiatry

## 2018-05-19 ENCOUNTER — Inpatient Hospital Stay (HOSPITAL_COMMUNITY)
Admission: AD | Admit: 2018-05-19 | Discharge: 2018-05-21 | DRG: 897 | Disposition: A | Discharge status: Home / Self Care | Payer: Federal, State, Local not specified - Other | Source: Intra-hospital | Attending: Psychiatry | Admitting: Psychiatry

## 2018-05-19 DIAGNOSIS — R45851 Suicidal ideations: Secondary | ICD-10-CM | POA: Diagnosis present

## 2018-05-19 DIAGNOSIS — G4700 Insomnia, unspecified: Secondary | ICD-10-CM | POA: Diagnosis present

## 2018-05-19 DIAGNOSIS — F1410 Cocaine abuse, uncomplicated: Secondary | ICD-10-CM | POA: Diagnosis present

## 2018-05-19 DIAGNOSIS — F332 Major depressive disorder, recurrent severe without psychotic features: Secondary | ICD-10-CM

## 2018-05-19 DIAGNOSIS — F1094 Alcohol use, unspecified with alcohol-induced mood disorder: Secondary | ICD-10-CM | POA: Diagnosis not present

## 2018-05-19 DIAGNOSIS — F1024 Alcohol dependence with alcohol-induced mood disorder: Secondary | ICD-10-CM | POA: Diagnosis present

## 2018-05-19 DIAGNOSIS — F329 Major depressive disorder, single episode, unspecified: Secondary | ICD-10-CM | POA: Diagnosis present

## 2018-05-19 DIAGNOSIS — F1220 Cannabis dependence, uncomplicated: Secondary | ICD-10-CM

## 2018-05-19 DIAGNOSIS — F10229 Alcohol dependence with intoxication, unspecified: Secondary | ICD-10-CM | POA: Diagnosis present

## 2018-05-19 DIAGNOSIS — F17210 Nicotine dependence, cigarettes, uncomplicated: Secondary | ICD-10-CM | POA: Diagnosis present

## 2018-05-19 DIAGNOSIS — F410 Panic disorder [episodic paroxysmal anxiety] without agoraphobia: Secondary | ICD-10-CM | POA: Diagnosis present

## 2018-05-19 DIAGNOSIS — F1420 Cocaine dependence, uncomplicated: Secondary | ICD-10-CM | POA: Diagnosis present

## 2018-05-19 HISTORY — DX: Anxiety disorder, unspecified: F41.9

## 2018-05-19 MED ORDER — ACETAMINOPHEN 325 MG PO TABS: 650.0000 mg | ORAL_TABLET | Freq: Four times a day (QID) | ORAL | Status: DC | PRN

## 2018-05-19 MED ORDER — FLUOXETINE HCL 20 MG PO CAPS
20.0000 mg | ORAL_CAPSULE | Freq: Every day | ORAL | Status: DC
  Administered 2018-05-20 – 2018-05-21 (×2): 20 mg via ORAL
  Filled 2018-05-19 (×3): qty 1
  Filled 2018-05-19: qty 7

## 2018-05-19 MED ORDER — HYDROXYZINE HCL 25 MG PO TABS
25.0000 mg | ORAL_TABLET | Freq: Four times a day (QID) | ORAL | Status: DC | PRN
  Administered 2018-05-19 – 2018-05-20 (×3): 25 mg via ORAL
  Filled 2018-05-19 (×2): qty 1

## 2018-05-19 MED ORDER — MAGNESIUM HYDROXIDE 400 MG/5ML PO SUSP: 30.0000 mL | Freq: Every day | ORAL | Status: DC | PRN

## 2018-05-19 MED ORDER — TRAZODONE HCL 50 MG PO TABS
50.0000 mg | ORAL_TABLET | Freq: Every evening | ORAL | Status: DC | PRN
  Administered 2018-05-19 – 2018-05-20 (×2): 50 mg via ORAL
  Filled 2018-05-19: qty 1

## 2018-05-19 MED ORDER — ADULT MULTIVITAMIN W/MINERALS CH
1.0000 | ORAL_TABLET | Freq: Every day | ORAL | Status: DC
  Administered 2018-05-19 – 2018-05-21 (×3): 1 via ORAL
  Filled 2018-05-19 (×6): qty 1

## 2018-05-19 MED ORDER — NICOTINE 21 MG/24HR TD PT24
21.0000 mg | MEDICATED_PATCH | Freq: Every day | TRANSDERMAL | Status: DC
  Filled 2018-05-19 (×2): qty 1

## 2018-05-19 MED ORDER — LORAZEPAM 1 MG PO TABS
1.0000 mg | ORAL_TABLET | Freq: Once | ORAL | Status: AC
  Administered 2018-05-19: 1 mg via ORAL
  Filled 2018-05-19: qty 1

## 2018-05-19 MED ORDER — LOPERAMIDE HCL 2 MG PO CAPS: 2.0000 mg | ORAL_CAPSULE | ORAL | Status: DC | PRN

## 2018-05-19 MED ORDER — LORAZEPAM 1 MG PO TABS
1.0000 mg | ORAL_TABLET | Freq: Four times a day (QID) | ORAL | Status: DC | PRN
  Administered 2018-05-20: 1 mg via ORAL
  Filled 2018-05-19: qty 1

## 2018-05-19 NOTE — ED Notes (Signed)
Pt talking on hallway phone.  

## 2018-05-19 NOTE — ED Notes (Signed)
Pt taking a shower 

## 2018-05-19 NOTE — ED Notes (Signed)
Pt transported safely with [employer].  Pt was calm and cooperative.   All belongings were sent with patient.

## 2018-05-19 NOTE — ED Notes (Signed)
Pt is at phone crying. Pt asked this Clinical research associatewriter for something for his anxiety. RN, Morrie Sheldonshley told of pts request.

## 2018-05-19 NOTE — Tx Team (Signed)
Initial Treatment Plan 05/19/2018 4:49 PM Reginald Patton Constancio [id number]    PATIENT STRESSORS: Marital or family conflict Substance abuse   PATIENT STRENGTHS: Ability for insight Average or above average intelligence Capable of independent living General fund of knowledge Motivation for treatment/growth   PATIENT IDENTIFIED PROBLEMS: Depression Suicidal thoughts Substance Abuse "I'm not suicidal, I didn't take the whole bottle of tylenol like they said"                     DISCHARGE CRITERIA:  Ability to meet basic life and health needs Improved stabilization in mood, thinking, and/or behavior Reduction of life-threatening or endangering symptoms to within safe limits Verbal commitment to aftercare and medication compliance Withdrawal symptoms are absent or subacute and managed without 24-hour nursing intervention  PRELIMINARY DISCHARGE PLAN: Attend aftercare/continuing care group Return to previous living arrangement  PATIENT/FAMILY INVOLVEMENT: This treatment plan has been presented to and reviewed with the patient, Reginald Patton Bruster, and/or family member, .  The patient and family have been given the opportunity to ask questions and make suggestions.  Samaya Boardley, [place], [place]RN 05/19/2018, 4:49 PM

## 2018-05-19 NOTE — Plan of Care (Signed)
  Problem: Education: Goal: Knowledge of [facility] Education information/materials will improve Outcome: Progressing   Problem: Safety: Goal: Periods of time without injury will increase Outcome: Progressing   Patient oriented to the unit. Patient remains safe and will continue to monitor.  

## 2018-05-19 NOTE — ED Notes (Signed)
Visitor at bedside.
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
## 2018-05-19 NOTE — Progress Notes (Signed)
Reginald Patton is a 37 year old male pt admitted on involuntary basis. On admission, he denies SI and reports that he did not take the whole of tylenol and reports that he did not write a suicide note. He denies any SI currently and is able to contract for safety in the hospital. He does endorse drinking on "most days" after work but denies any problems and reports that he usually only has 1 or 2 when he is drinking and does not display any overt signs or symptoms of withdrawal. He reports that he is not currently on any medications and does not have a PCP. He reports that he lives with his girlfriend and reports that he will return there once he is discharged. Reginald Patton was escorted to the unit, oriented to the milieu and safety maintained.

## 2018-05-19 NOTE — Progress Notes (Signed)
D: Patient is alert and cooperative. Patient denies SI, HI, AVH, and verbally contracts for safety. Patient reports a misunderstanding with his girlfriend and never being suicidal to this RN. Patient reports occasional alcohol use to this RN. Patient denies physical symptoms/pain. Patient requests PRN sleep medication.    A: PRN sleep medication administered per MD order. Support provided. Patient educated on safe therapeutic environment provided for discussion of mental status and substance use. Patient educated on safety on the unit and medications. Routine safety checks every 15 minutes. Patient stated understanding to tell nurse about any new physical symptoms. Patient understands to tell staff of any needs.     R: No adverse drug reactions noted. Patient verbally contracts for safety. Patient remains safe at this time and will continue to monitor.

## 2018-05-20 DIAGNOSIS — F1420 Cocaine dependence, uncomplicated: Secondary | ICD-10-CM | POA: Diagnosis present

## 2018-05-20 DIAGNOSIS — F1094 Alcohol use, unspecified with alcohol-induced mood disorder: Secondary | ICD-10-CM

## 2018-05-20 DIAGNOSIS — F1220 Cannabis dependence, uncomplicated: Secondary | ICD-10-CM

## 2018-05-20 DIAGNOSIS — F10229 Alcohol dependence with intoxication, unspecified: Secondary | ICD-10-CM

## 2018-05-20 LAB — TSH: TSH: 1.489 u[IU]/mL (ref 0.350–4.500)

## 2018-05-20 NOTE — Progress Notes (Signed)
Pt was observed in the dayroom, seen watching TV. No interaction with peers. Pt appears anxious/animated/restless in affect and mood. Pt denies SI/HI/AVH/Pain at this time. Pt states he's been resting in bed all day. Pt states he hopes to go home tomorrow. PRN vistaril and trazodone requested and given. Support and encouragement offered. Will continue with POC.

## 2018-05-20 NOTE — H&P (Signed)
Psychiatric Admission Assessment Adult  Patient Identification: Reginald Patton MRN:  [id number] Date of Evaluation:  05/20/2018 Chief Complaint:  mdd alcohol disorder Principal Diagnosis: <principal problem not specified> Diagnosis:  Active Problems:   MDD (major depressive disorder)  History of Present Illness: Patient is seen and examined.  Patient is a 37 year old male with a past psychiatric history significant for alcohol use disorder, cocaine use disorder, marijuana use disorder, and a past psychiatric history significant for opiate dependence in remission who [facility] department on 11/18 after a reported overdose of Tylenol with alcohol.  On admission the patient was intoxicated.  His blood alcohol was 199.  According to the notes in the emergency department he wrote a note stating that he wanted to end it all.  He had overdosed on Tylenol.  He was drinking significantly on the night of admission.  He stated that he had taken a couple of Tylenol for headache, but then knocked over the bottle.  He denied any overdose of Tylenol.  His Tylenol level was less than 10 on admission.  During the interview in the emergency room he was tearful throughout, and appeared significantly depressed.  He wanted to be discharged home but because of his job last night, but was placed under involuntary commitment.  He does have a history of a DUI, but denied any problems with alcohol withdrawal in the past.  He denied any seizures, tremors or withdrawal symptoms.  He stated that he had been addicted to opiates in the past, but did quit using them.  He laughs about what occurred last night and admitted that he is "been partying too much".  He denied any depressive symptoms or previous attempts to kill himself.  He stated that his father and girlfriend did worry about him being depressed.  He did agree to take the fluoxetine.  He denied any helplessness, hopelessness or  worthlessness.  His main concern right now is being able to get to his job as soon as possible.  Associated Signs/Symptoms: Depression Symptoms:  depressed mood, anhedonia, insomnia, psychomotor agitation, fatigue, feelings of worthlessness/guilt, difficulty concentrating, hopelessness, suicidal thoughts without plan, suicidal attempt, anxiety, panic attacks, loss of energy/fatigue, disturbed sleep, (Hypo) Manic Symptoms:  Impulsivity, Irritable Mood, Labiality of Mood, Anxiety Symptoms:  Excessive Worry, Psychotic Symptoms:  Denied PTSD Symptoms: Negative Total Time spent with patient: 30 minutes  Past Psychiatric History: Patient denied any previous treatment for depression.  He had been admitted for opiate issues in the past.  He is not used opiates except intermittently for several years.  He continues to drink alcohol.  His longest period of sobriety was approximately 1 week in the last 6 months.  He also uses cocaine and marijuana.  He does have a DUI.  Is the patient at risk to self? No.  Has the patient been a risk to self in the past 6 months? No.  Has the patient been a risk to self within the distant past? No.  Is the patient a risk to others? No.  Has the patient been a risk to others in the past 6 months? No.  Has the patient been a risk to others within the distant past? No.   Prior Inpatient Therapy:   Prior Outpatient Therapy:    Alcohol Screening: 1. How often do you have a drink containing alcohol?: 4 or more times a week 2. How many drinks containing alcohol do you have on a typical day when you are  drinking?: 3 or 4 3. How often do you have six or more drinks on one occasion?: Weekly AUDIT-C Score: 8 4. How often during the last year have you found that you were not able to stop drinking once you had started?: Never 5. How often during the last year have you failed to do what was normally expected from you becasue of drinking?: Never 6. How often during  the last year have you needed a first drink in the morning to get yourself going after a heavy drinking session?: Never 7. How often during the last year have you had a feeling of guilt of remorse after drinking?: Never 8. How often during the last year have you been unable to remember what happened the night before because you had been drinking?: Never 9. Have you or someone else been injured as a result of your drinking?: No 10. Has a relative or friend or a doctor or another health worker been concerned about your drinking or suggested you cut down?: No Alcohol Use Disorder Identification Test Final Score (AUDIT): 8 Intervention/Follow-up: Alcohol Education Substance Abuse History in the last 12 months:  Yes.   Consequences of Substance Abuse: Legal Consequences:  He does have a DUI. Previous Psychotropic Medications: No  Psychological Evaluations: Yes  Past Medical History:  Past Medical History:  Diagnosis Date  . Anxiety   . Substance abuse (HCC)    History reviewed. No pertinent surgical history. Family History: History reviewed. No pertinent family history. Family Psychiatric  History: Denied any family history of psychiatric or substance abuse issues Tobacco Screening: Have you used any form of tobacco in the last 30 days? (Cigarettes, Smokeless Tobacco, Cigars, and/or Pipes): Yes Tobacco use, Select all that apply: 5 or more cigarettes per day Are you interested in Tobacco Cessation Medications?: Yes, will notify MD for an order Counseled patient on smoking cessation including recognizing danger situations, developing coping skills and basic information about quitting provided: Refused/Declined practical counseling Social History:  Social History   Substance and Sexual Activity  Alcohol Use Yes   Comment: Daily     Social History   Substance and Sexual Activity  Drug Use Yes  . Frequency: 7.0 times per week  . Types: Cocaine, Marijuana   Comment: Daily use of THC;  previous use of opioids    Additional Social History:                           Allergies:   Allergies  Allergen Reactions  . Penicillins Other (See Comments)    Childhood allergy; Unknown reaction  Has patient had a PCN reaction causing immediate rash, facial/tongue/throat swelling, SOB or lightheadedness with hypotension:unsure Has patient had a PCN reaction causing severe rash involving mucus membranes or skin necrosis:unsure Has patient had a PCN reaction that required hospitalization:unsure Has patient had a PCN reaction occurring within the last 10 years:unsure If all of the above answers are "NO", then may proceed with Cephalosporin use.    Lab Results:  Results for orders placed or performed during the hospital encounter of 05/19/18 (from the past 48 hour(s))  TSH     Status: None   Collection Time: 05/20/18  6:39 AM  Result Value Ref Range   TSH 1.489 0.350 - 4.500 uIU/mL    Comment: Performed by a 3rd Generation assay with a functional sensitivity of <=0.01 uIU/mL. Performed at [facility], [address], [place], [place] [address]  Blood Alcohol level:  Lab Results  Component Value Date   ETH 199 (H) 05/18/2018   ETH <11 01/16/2013    Metabolic Disorder Labs:  No results found for: HGBA1C, MPG No results found for: PROLACTIN No results found for: CHOL, TRIG, HDL, CHOLHDL, VLDL, LDLCALC  Current Medications: Current Facility-Administered Medications  Medication Dose Route Frequency Provider Last Rate Last Dose  . acetaminophen (TYLENOL) tablet 650 mg  650 mg Oral Q6H PRN Oneta Rack, NP      . FLUoxetine (PROZAC) capsule 20 mg  20 mg Oral Daily Oneta Rack, NP   20 mg at 05/20/18 0805  . hydrOXYzine (ATARAX/VISTARIL) tablet 25 mg  25 mg Oral Q6H PRN Oneta Rack, NP   25 mg at 05/19/18 1713  . loperamide (IMODIUM) capsule 2-4 mg  2-4 mg Oral PRN Oneta Rack, NP      . LORazepam (ATIVAN) tablet 1 mg  1 mg Oral  Q6H PRN Oneta Rack, NP      . magnesium hydroxide (MILK OF MAGNESIA) suspension 30 mL  30 mL Oral Daily PRN Oneta Rack, NP      . multivitamin with minerals tablet 1 tablet  1 tablet Oral Daily Oneta Rack, NP   1 tablet at 05/20/18 0805  . traZODone (DESYREL) tablet 50 mg  50 mg Oral QHS PRN Oneta Rack, NP   50 mg at 05/19/18 2041   PTA Medications: Medications Prior to Admission  Medication Sig Dispense Refill Last Dose  . acetaminophen (TYLENOL) 500 MG tablet Take 500 mg by mouth every 6 (six) hours as needed (for pain.).    Past Month at Unknown time  . Aspirin-Salicylamide-Caffeine (BC HEADACHE POWDER PO) Take 1 Package by mouth 3 (three) times daily as needed (pain).   Past Month at Unknown time    Musculoskeletal: Strength & Muscle Tone: within normal limits Gait & Station: normal Patient leans: N/A  Psychiatric Specialty Exam: Physical Exam  Nursing note and vitals reviewed. Constitutional: He is oriented to person, place, and time. He appears well-developed and well-nourished.  HENT:  Head: Normocephalic and atraumatic.  Respiratory: Effort normal.  Neurological: He is alert and oriented to person, place, and time.    ROS  Blood pressure (!) 114/93, pulse 71, temperature (!) 97.5 F (36.4 C), resp. rate 16, height 5\' 10"  (1.778 m), weight 78 kg.Body mass index is 24.68 kg/m.  General Appearance: Casual  Eye Contact:  Fair  Speech:  Normal Rate  Volume:  Normal  Mood:  Euthymic  Affect:  Congruent  Thought Process:  Coherent and Descriptions of Associations: Circumstantial  Orientation:  Full (Time, Place, and Person)  Thought Content:  Logical  Suicidal Thoughts:  No  Homicidal Thoughts:  No  Memory:  Immediate;   Fair Recent;   Fair Remote;   Fair  Judgement:  Intact  Insight:  Lacking  Psychomotor Activity:  Normal  Concentration:  Concentration: Fair and Attention Span: Fair  Recall:  [employer] of Knowledge:  Fair  Language:  Good   Akathisia:  Negative  Handed:  Right  AIMS (if indicated):     Assets:  Communication Skills Desire for Improvement Housing Physical Health Resilience Social Support  ADL's:  Intact  Cognition:  WNL  Sleep:  Number of Hours: 6.25    Treatment Plan Summary: Daily contact with patient to assess and evaluate symptoms and progress in treatment, Medication management and Plan : Patient is seen and examined.  Patient is a 37 year old male with a past psychiatric history significant for probable alcohol dependence, alcohol induced mood disorder, possible major depression, cocaine use disorder and marijuana use disorder.  He was admitted to the hospital after reported overdose of Tylenol with alcohol.  He was intoxicated at the time.  His blood alcohol was 199.  He denied any depressive symptoms this morning.  His main concern is to get back to his job.  He has agreed to a trial of fluoxetine.  He will be started on 10 mg p.o. daily and this will be continued.  We will contact his girlfriend for collateral information.  He denied any previous history of significant withdrawal symptoms.  He has a DUI in the past.  His MCV was normal, liver function enzymes are normal, Tylenol level was less than 10.  He denied suicidal or homicidal ideation currently.  He is not psychotic.  He will be admitted to the hospital.  He will be monitored for withdrawal symptoms.  He has been given thiamine and folic acid.  He does have benzodiazepines for alcohol withdrawal symptoms if his CIWA>10.  He will be encouraged to attend groups.  He will be encouraged to work on his coping skills.  He will be encouraged to become more involved with cessation of his substance abuse issues.  Observation Level/Precautions:  Detox 15 minute checks  Laboratory:  Chemistry Profile  Psychotherapy:    Medications:    Consultations:    Discharge Concerns:    Estimated LOS:  Other:     Physician Treatment Plan for Primary Diagnosis:  <principal problem not specified> Long Term Goal(s): Improvement in symptoms so as ready for discharge  Short Term Goals: Ability to identify changes in lifestyle to reduce recurrence of condition will improve, Ability to verbalize feelings will improve, Ability to disclose and discuss suicidal ideas, Ability to demonstrate self-control will improve, Ability to identify and develop effective coping behaviors will improve, Ability to maintain clinical measurements within normal limits will improve and Ability to identify triggers associated with substance abuse/mental health issues will improve  Physician Treatment Plan for Secondary Diagnosis: Active Problems:   MDD (major depressive disorder)  Long Term Goal(s): Improvement in symptoms so as ready for discharge  Short Term Goals: Ability to identify changes in lifestyle to reduce recurrence of condition will improve, Ability to verbalize feelings will improve, Ability to disclose and discuss suicidal ideas, Ability to demonstrate self-control will improve, Ability to identify and develop effective coping behaviors will improve, Ability to maintain clinical measurements within normal limits will improve and Ability to identify triggers associated with substance abuse/mental health issues will improve  I certify that inpatient services furnished can reasonably be expected to improve the patient's condition.    Antonieta Pert, MD 11/19/201911:54 AM

## 2018-05-20 NOTE — Progress Notes (Signed)
Pt presented animated during shift assessment. Pt noted to be fidgety and smiling during the assessment. Pt denied SI/HI/AVH. Pt denied feeling depressed or anxious. Pt reported fair sleep last night. No concerns verbalized by pt. Pt compliant with taking newly ordered med. No side effects to meds verbalized by pt.  Medications reviewed with pt. Medications administered as ordered per MD. Verbal support provided. Pt encouraged to attend groups. 15 minute checks performed for safety. V/s assessed.   Pt receptive to tx plan.

## 2018-05-20 NOTE — Progress Notes (Signed)
Patient attended group and participated

## 2018-05-20 NOTE — Progress Notes (Signed)
Adult Psychoeducational Group Note  Date:  05/20/2018 Time:  9:07 PM  Group Topic/Focus:  Wrap-Up Group:   The focus of this group is to help patients review their daily goal of treatment and discuss progress on daily workbooks.  Participation Level:  Active  Participation Quality:  Appropriate  Affect:  Appropriate  Cognitive:  Appropriate  Insight: Appropriate  Engagement in Group:  Engaged  Modes of Intervention:  Discussion  Additional Comments:  Pt slept most of the day.  Pt had not goals for the day.  Pt rated the day at a 5/10.  Kaylie Ritter 05/20/2018, 9:07 PM

## 2018-05-20 NOTE — [facility] Suicide Risk Assessment (Signed)
[facility] Admission Suicide Risk Assessment   Nursing information obtained from:  Patient Demographic factors:  Male, Caucasian Current Mental Status:  NA Loss Factors:  NA Historical Factors:  Family history of mental illness or substance abuse Risk Reduction Factors:  Positive therapeutic relationship, Positive coping skills or problem solving skills  Total Time spent with patient: 30 minutes Principal Problem: <principal problem not specified> Diagnosis:  Active Problems:   MDD (major depressive disorder)  Subjective Data: Patient is seen and examined.  Patient is a 37 year old male with a past psychiatric history significant for alcohol use disorder, cocaine use disorder, marijuana use disorder, and a past psychiatric history significant for opiate dependence in remission who presented to the [facility] emergency department on 11/18 after a reported overdose of Tylenol with alcohol.  On admission the patient was intoxicated.  His blood alcohol was 199.  According to the notes in the emergency department he wrote a note stating that he wanted to end it all.  He had overdosed on Tylenol.  He was drinking significantly on the night of admission.  He stated that he had taken a couple of Tylenol for headache, but then knocked over the bottle.  He denied any overdose of Tylenol.  His Tylenol level was less than 10 on admission.  During the interview in the emergency room he was tearful throughout, and appeared significantly depressed.  He wanted to be discharged home but because of his job last night, but was placed under involuntary commitment.  He does have a history of a DUI, but denied any problems with alcohol withdrawal in the past.  He denied any seizures, tremors or withdrawal symptoms.  He stated that he had been addicted to opiates in the past, but did quit using them.  He laughs about what occurred last night and admitted that he is "been partying too much".  He denied any  depressive symptoms or previous attempts to kill himself.  He stated that his father and girlfriend did worry about him being depressed.  He did agree to take the fluoxetine.  He denied any helplessness, hopelessness or worthlessness.  His main concern right now is being able to get to his job as soon as possible.  Continued Clinical Symptoms:  Alcohol Use Disorder Identification Test Final Score (AUDIT): 8 The "Alcohol Use Disorders Identification Test", Guidelines for Use in Primary Care, Second Edition.  World Science writer [facility]). Score between 0-7:  no or low risk or alcohol related problems. Score between 8-15:  moderate risk of alcohol related problems. Score between 16-19:  high risk of alcohol related problems. Score 20 or above:  warrants further diagnostic evaluation for alcohol dependence and treatment.   CLINICAL FACTORS:   Depression:   Comorbid alcohol abuse/dependence Impulsivity Alcohol/Substance Abuse/Dependencies   Musculoskeletal: Strength & Muscle Tone: within normal limits Gait & Station: normal Patient leans: N/A  Psychiatric Specialty Exam: Physical Exam  Nursing note and vitals reviewed. Constitutional: He is oriented to person, place, and time. He appears well-developed and well-nourished.  HENT:  Head: Normocephalic and atraumatic.  Respiratory: Effort normal.  Neurological: He is alert and oriented to person, place, and time.    ROS  Blood pressure (!) 114/93, pulse 71, temperature (!) 97.5 F (36.4 C), resp. rate 16, height 5\' 10"  (1.778 m), weight 78 kg.Body mass index is 24.68 kg/m.  General Appearance: Casual  Eye Contact:  Fair  Speech:  Normal Rate  Volume:  Normal  Mood:  Anxious  Affect:  Congruent  Thought Process:  Coherent and Descriptions of Associations: Circumstantial  Orientation:  Full (Time, Place, and Person)  Thought Content:  Logical  Suicidal Thoughts:  No  Homicidal Thoughts:  No  Memory:  Immediate;   Fair Recent;    Fair Remote;   Fair  Judgement:  Impaired  Insight:  Lacking  Psychomotor Activity:  Normal  Concentration:  Concentration: Fair and Attention Span: Fair  Recall:  [employer]  Fund of Knowledge:  Fair  Language:  Fair  Akathisia:  Negative  Handed:  Right  AIMS (if indicated):     Assets:  Communication Skills Desire for Improvement Financial Resources/Insurance Housing Intimacy Physical Health Resilience  ADL's:  Intact  Cognition:  WNL  Sleep:  Number of Hours: 6.25      COGNITIVE FEATURES THAT CONTRIBUTE TO RISK:  Closed-mindedness    SUICIDE RISK:   Minimal: No identifiable suicidal ideation.  Patients presenting with no risk factors but with morbid ruminations; may be classified as minimal risk based on the severity of the depressive symptoms  PLAN OF CARE: Patient is seen and examined.  Patient is a 37 year old male with a past psychiatric history significant for probable alcohol dependence, alcohol induced mood disorder, possible major depression, cocaine use disorder and marijuana use disorder.  He was admitted to the hospital after reported overdose of Tylenol with alcohol.  He was intoxicated at the time.  His blood alcohol was 199.  He denied any depressive symptoms this morning.  His main concern is to get back to his job.  He has agreed to a trial of fluoxetine.  He will be started on 10 mg p.o. daily and this will be continued.  We will contact his girlfriend for collateral information.  He denied any previous history of significant withdrawal symptoms.  He has a DUI in the past.  His MCV was normal, liver function enzymes are normal, Tylenol level was less than 10.  He denied suicidal or homicidal ideation currently.  He is not psychotic.  He will be admitted to the hospital.  He will be monitored for withdrawal symptoms.  He has been given thiamine and folic acid.  He does have benzodiazepines for alcohol withdrawal symptoms if his CIWA>10.  He will be encouraged to attend  groups.  He will be encouraged to work on his coping skills.  He will be encouraged to become more involved with cessation of his substance abuse issues.  I certify that inpatient services furnished can reasonably be expected to improve the patient's condition.   Antonieta PertGreg Lawson Clary, MD 05/20/2018, 9:58 AM

## 2018-05-20 NOTE — B[facility] Counselor (Signed)
Adult Comprehensive Assessment  Patient ID: Reginald Patton, male   DOB: 10/14/1980, 37 y.o.   MRN: [id number]  Information Source: Information source: Patient  Current Stressors:  Patient states their primary concerns and needs for treatment are:: "I dont really have any concerns, I made a mistake" Patient states their goals for this hospitilization and ongoing recovery are:: "I guess I want to be a better person"  Educational / Learning stressors: Denies any stressors  Employment / Job issues: Employed; Denies any stressors  Family Relationships: Denies any stressors  Financial / Lack of resources (include bankruptcy): Patient reports his finances are currently strained  Housing / Lack of housing: Lives with girlfriend; Denies any stressors  Physical health (include injuries & life threatening diseases): Denies any stressors  Social relationships: Denies any stressors  Substance abuse: Alcohol and Cannabis-- occassionally  Bereavement / Loss: Denies any stressors   Living/Environment/Situation:  Living Arrangements: Spouse/significant other Living conditions (as described by patient or guardian): "good" Who else lives in the home?: Girlfriend  How long has patient lived in current situation?: 2 years  What is atmosphere in current home: Comfortable, ParamedicLoving  Family History:  Marital status: Long term relationship Long term relationship, how long?: 6 yeats  What types of issues is patient dealing with in the relationship?: Denies any stressors  Additional relationship information: No Are you sexually active?: Yes What is your sexual orientation?: Heterosexual  Has your sexual activity been affected by drugs, alcohol, medication, or emotional stress?: No  Does patient have children?: No  Childhood History:  By whom was/is the patient raised?: Father Description of patient's relationship with caregiver when they were a child: Patient reports having a good relationship with her father  during his childhood  Patient's description of current relationship with people who raised him/her: Patient reports he continues to have a good relationship with his father  How were you disciplined when you got in trouble as a child/adolescent?: Punishment Does patient have siblings?: Yes Number of Siblings: 4 Description of patient's current relationship with siblings: patient reports having a good relationship with his two brothers and two sisters  Did patient suffer any verbal/emotional/physical/sexual abuse as a child?: No Did patient suffer from severe childhood neglect?: No Has patient ever been sexually abused/assaulted/raped as an adolescent or adult?: No Was the patient ever a victim of a crime or a disaster?: No Witnessed domestic violence?: No Has patient been effected by domestic violence as an adult?: No  Education:  Highest grade of school patient has completed: 10th grade  Currently a student?: No Learning disability?: No  Employment/Work Situation:   Employment situation: Employed 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  How long has patient been employed?: 20 years  Patient's job has been impacted by current illness: No What is the longest time patient has a held a job?: 20 years  Where was the patient employed at that time?: Current job  Did You Receive Any Psychiatric Treatment/Services While in [employer]?: No Are There Guns or Other Weapons in Your Home?: No  Financial Resources:   Financial resources: Income from employment Does patient have a representative payee or guardian?: No  Alcohol/Substance Abuse:   What has been your use of drugs/alcohol within the last 12 months?: Patient reports drinking alcohol and smoking cannabis intermittently.  If attempted suicide, did drugs/alcohol play a role in this?: No Alcohol/Substance Abuse Treatment Hx: Denies past history Has alcohol/substance abuse ever caused legal problems?:  No  Social Support System:  Patient's Community Support System: Production assistant, radio System: "my family" Type of faith/religion: None  How does patient's faith help to cope with current illness?: N/A   Leisure/Recreation:   Leisure and Hobbies: "Play the game and watching TV"  Strengths/Needs:   What is the patient's perception of their strengths?: "I'm hard working, polite and I am nice"  Patient states they can use these personal strengths during their treatment to contribute to their recovery: Yes  Patient states these barriers may affect/interfere with their treatment: No  Patient states these barriers may affect their return to the community: No  Other important information patient would like considered in planning for their treatment: No  Discharge Plan:   Currently receiving community mental health services: No Patient states concerns and preferences for aftercare planning are: Patient declines referrals at this time  Patient states they will know when they are safe and ready for discharge when: Patient reports he is ready for discharge  Does patient have access to transportation?: Yes Does patient have financial barriers related to discharge medications?: Yes Patient description of barriers related to discharge medications: No insurance  Will patient be returning to same living situation after discharge?: Yes  Summary/Recommendations:   Summary and Recommendations (to be completed by the evaluator): Reginald Patton is a 37 year old male who is diagnosed with Major Depressive Disorder, Recurrent Ep., Severe w/o psychotic features and Alcohol Use Disorder. He presented to the hospital for a possible overdose, that the patient reports was not intentional. During the assessment, Reginald Patton was pleasant and cooperative with providing information. Reginald Patton reports that his overdose was a mistake and that he was intoxicated and made a mistake. Reginald Patton states that he does not feel that he  needs to follow up with a psychiatrist or therapist at [facility] and has declined any referrals. Reginald Patton can benefit from crisis stabilization, medication management, therapeutic milieu and referral services.   Reginald Patton. 05/20/2018

## 2018-05-20 NOTE — [facility] Suicide Risk Assessment (Signed)
[facility] INPATIENT:  Family/Significant Other Suicide Prevention Education  Suicide Prevention Education:  Patient Refusal for Family/Significant Other Suicide Prevention Education: The patient Reginald Patton has refused to provide written consent for family/significant other to be provided Family/Significant Other Suicide Prevention Education during admission and/or prior to discharge.  Physician notified.  SPE completed with patient, as patient refused to consent to family contact. Patient was encouraged to share information with support network, ask questions, and talk about any concerns relating to SPE. Patient denies access to guns/firearms and verbalized understanding of information provided. Mobile Crisis information also provided to patient.    Maeola SarahJolan E Lillyian Heidt 05/20/2018, 2:45 PM

## 2018-05-20 NOTE — [facility] Group Notes (Signed)
Adult Nursing Psychoeducational Group Note  Date:  05/20/2018 Time:  4:00 PM  Group Topic/Focus: Early Warning Signs Early Warning Signs:   The focus of this group is to help patients identify signs or symptoms they exhibit before slipping into an unhealthy state or crisis.  Participation Level:  Did Not Attend  Additional Comments:  Patient was with SW during group.  Rae Lipsmanda A Jontez Redfield 05/20/2018, 4:45 PM

## 2018-05-21 MED ORDER — FLUOXETINE HCL 20 MG PO CAPS: 20.0000 mg | ORAL_CAPSULE | Freq: Every day | ORAL | 3 refills | Status: DC

## 2018-05-21 NOTE — Progress Notes (Signed)
Recreation Therapy Notes  Date: 11.20.19 Time: 0930 Location: [address] Dayroom  Group Topic: Stress Management  Goal Area(s) Addresses:  Patient will verbalize importance of using healthy stress management.  Patient will identify positive emotions associated with healthy stress management.   Intervention: Stress Management  Activity :  Guided Imagery.  LRT introduced the stress management technique of guided imagery.  LRT read a script to guide patients through a peaceful meadow.  Patients were to follow along as script was read to engage in activity.  Education:  Stress Management, Discharge Planning.   Education Outcome: Acknowledges edcuation/In group clarification offered/Needs additional education  Clinical Observations/Feedback: Pt did not attend group.    Tiarah Shisler, LRT/CTRS         Mort Smelser A 05/21/2018 11:30 AM 

## 2018-05-21 NOTE — Progress Notes (Signed)
  [facility]  Will you be returning to the same living situation after discharge:  Yes,  patient reports that he is returning home with his girlfriend At discharge, do you have transportation home?: Yes,  patient reports his father is picking him up at discharge Do you have the ability to pay for your medications: No.  Release of information consent forms completed and in the chart;  Patient's signature needed at discharge.  Patient to Follow up at: Follow-up Information    PATIENT DECLINED REFERRALS Follow up.   Why:  PATIENT DECLINED REFERRALS  Contact information: PATIENT DECLINED REFERRALS           Next level of care provider has access to [facility] Link:yes  Safety Planning and Suicide Prevention discussed: Yes,  with the patient   Have you used any form of tobacco in the last 30 days? (Cigarettes, Smokeless Tobacco, Cigars, and/or Pipes): Yes  Has patient been referred to the Quitline?: Patient refused referral  Patient has been referred for addiction treatment: N/A  Maeola SarahJolan E Glendora Clouatre, LCSWA 05/21/2018, 2:17 PM

## 2018-05-21 NOTE — Discharge Summary (Signed)
Physician Discharge Summary Note  Patient:  Reginald Patton is an 37 y.o., male MRN:  [id number] DOB:  02/04/1981 Patient phone:  [phone] (home)  [place]   [address]  [place] [phone],  Total Time spent with patient: Greater than 30 minutes  Date of Admission:  05/19/2018  Date of Discharge: 05-22-18  Reason for Admission: Tylenol overdose with alcohol.  Principal Problem: Alcohol dependence with intoxication with complication [facility](HCC) Discharge Diagnoses: Principal Problem:   Alcohol dependence with intoxication with complication (HCC) Active Problems:   MDD (major depressive disorder)   Alcohol intoxication with moderate or severe use disorder (HCC)   Alcohol-induced mood disorder (HCC)   Moderate cannabis use disorder (HCC)   Moderate cocaine use disorder (HCC)  Past Psychiatric History: Alcoholism, MDD  Past Medical History:  Past Medical History:  Diagnosis Date  . Anxiety   . Substance abuse (HCC)    History reviewed. No pertinent surgical history.  Family History: History reviewed. No pertinent family history.  Family Psychiatric  History: See H&P  History:  Social History   Substance and Sexual Activity  Alcohol Use Yes   Comment: Daily     Social History   Substance and Sexual Activity  Drug Use Yes  . Frequency: 7.0 times per week  . Types: Cocaine, Marijuana   Comment: Daily use of THC; previous use of opioids    Social History   Socioeconomic History  . Marital status: Significant Other    Spouse name: Not on file  . Number of children: Not on file  . Years of education: Not on file  . Highest education level: Not on file  Occupational History  . Occupation: Corporate investment bankerConstruction worker    Comment: episodes of unemployment  Social Needs  . Financial resource strain: Not on file  . Food insecurity:    Worry: Not on file    Inability: Not on file  . Transportation needs:    Medical: Not on file    Non-medical: Not on file   Tobacco Use  . Smoking status: Current Every Day Smoker    Packs/day: 1.00    Types: Cigarettes  . Smokeless tobacco: Never Used  Substance and Sexual Activity  . Alcohol use: Yes    Comment: Daily  . Drug use: Yes    Frequency: 7.0 times per week    Types: Cocaine, Marijuana    Comment: Daily use of THC; previous use of opioids  . Sexual activity: Yes  Lifestyle  . Physical activity:    Days per week: Not on file    Minutes per session: Not on file  . Stress: Not on file  Relationships  . Social connections:    Talks on phone: Not on file    Gets together: Not on file    Attends religious service: Not on file    Active member of club or organization: Not on file    Attends meetings of clubs or organizations: Not on file    Relationship status: Not on file  Other Topics Concern  . Not on file  Social History Narrative   [place]   Hospital Course: (Per Md's admission evaluation): Patient is seen and examined. Patient is a 37 year old male with a past psychiatric history significant for alcohol use disorder, cocaine use disorder, marijuana use disorder and a past psychiatric history significant for opiate dependence in remission who [facility] on [facility]/18 after a reported  overdose of Tylenol with alcohol. On admission the patient was intoxicated. His blood alcohol was 199. According to the notes in the emergency department he wrote a note stating that he wanted to end it all. He had overdosed on Tylenol. He was drinking significantly on the night of admission. He stated that he had taken a couple of Tylenol for headache, but then knocked over the bottle. He denied any overdose of Tylenol. His Tylenol level was less than 10 on admission. During the interview in the emergency room he was tearful throughout, and appeared significantly depressed. He wanted to be discharged home but because of  his job last night, but was placed under involuntary commitment. He does have a history of a DUI, but denied any problems with alcohol withdrawal in the past. He denied any seizures, tremors or withdrawal symptoms. He stated that he had been addicted to opiates in the past, but did quit using them.He laughs about what occurred last night and admitted that he is "been partying too much". He denied any depressive symptoms or previous attempts to kill himself. He stated that his father and girlfriend did worry about him being depressed. He did agree to take the fluoxetine. He denied any helplessness, hopelessness or worthlessness. His main concern right now is being able to get to his job as soon as possible.  Reginald Patton's stay in this [facility] hospital was rather very brief. He came to the [facility] hospital under an IVC petition due to an overdose on tylenol mixed with alcohol & was intoxicated on arrival to the ED. This led to his hospitalization for psychiatric evaluation & possible treatment.   After admission assessment, his symptoms were evaluated & noted. The medication regimen targeting those symptoms were initiated. Reginald Patton was started on the Ativan detox protocols as his toxicology test result was 199 & UDS positive for cocaine & THC. He was also enrolled in the group counseling sessions to learn coping skills that should help him after discharge to cope better & effectively to maintain mood stability/sobriety. He presented no other significant pre-existing medical issues that required treatment & or monitoring.   During his follow-up care evaluation this am by the attending psychiatrist, Ivin Booty asked to be discharged to go back home. He denies any symptoms of depression, anxiety or substance withdrawal symptoms. He denies any AVH, delusional thoughts or paranoia. He says he feels a little depressed & agreed on taking only Fluoxetine for it. He says he used to have alcohol problems but has learned to deal with it  & has not been drinking heavily lately. He says he is committed to abstaining from substances. He declines follow-up care as he says he does not need it. And because there are no clinical criteria to keep Reginald Patton admitted to the hospital, he will be discharged as requested.  Upon discharge, Reginald Patton appears much more in control of his mood & behavior than upon admission. His symptoms were reported as significantly improved or completely resolved There are currently, no active SI plans or intent, AVH, delusional thoughts or paranoia. He is going to pursue outpatient treatment on his own terms. He was provided one prescription on Fluoxetine 20 mg for depression. He left [facility] with all personal belongings in no apparent distress.  Physical Findings: AIMS: Facial and Oral Movements Muscles of Facial Expression: None, normal Lips and Perioral Area: None, normal Jaw: None, normal Tongue: None, normal,Extremity Movements Upper (arms, wrists, hands, fingers): None, normal Lower (legs, knees, ankles, toes): None, normal, Trunk Movements  Neck, shoulders, hips: None, normal, Overall Severity Severity of abnormal movements (highest score from questions above): None, normal Incapacitation due to abnormal movements: None, normal Patient's awareness of abnormal movements (rate only patient's report): No Awareness, Dental Status Current problems with teeth and/or dentures?: No Does patient usually wear dentures?: No  CIWA:  CIWA-Ar Total: 2 COWS:     Musculoskeletal: Strength & Muscle Tone: within normal limits Gait & Station: normal Patient leans: N/A  Psychiatric Specialty Exam: Physical Exam  Nursing note and vitals reviewed. Constitutional: He appears well-developed.  HENT:  Head: Normocephalic.  Eyes: Pupils are equal, round, and reactive to light.  Neck: Normal range of motion.  Cardiovascular: Normal rate.  Respiratory: Effort normal.  GI: Soft.  Genitourinary:  Genitourinary Comments:  Deferred  Musculoskeletal: Normal range of motion.  Neurological: He is alert.  Skin: Skin is warm.    Review of Systems  Constitutional: Negative.   HENT: Negative.   Respiratory: Negative.  Negative for cough and shortness of breath.   Cardiovascular: Negative.  Negative for chest pain and palpitations.  Gastrointestinal: Negative.  Negative for abdominal pain, heartburn, nausea and vomiting.  Genitourinary: Negative.   Musculoskeletal: Negative.   Skin: Negative.   Neurological: Negative.  Negative for dizziness.  Endo/Heme/Allergies: Negative.   Psychiatric/Behavioral: Positive for depression (Stable) and substance abuse (Hx. Cocaine, alcohol & THC use disorder). Negative for hallucinations, memory loss and suicidal ideas. The patient has insomnia (Stable). The patient is not nervous/anxious (Stable).     Blood pressure 116/80, pulse 63, temperature 97.7 F (36.5 C), temperature source Oral, resp. rate 20, height 5\' 10"  (1.778 m), weight 78 kg.Body mass index is 24.68 kg/m.  See Md's discharge SRA   Have you used any form of tobacco in the last 30 days? (Cigarettes, Smokeless Tobacco, Cigars, and/or Pipes): Yes  Has this patient used any form of tobacco in the last 30 days? (Cigarettes, Smokeless Tobacco, Cigars, and/or Pipes): N/A  Blood Alcohol level:  Lab Results  Component Value Date   ETH 199 (H) 05/18/2018   ETH <11 01/16/2013   Metabolic Disorder Labs:  No results found for: HGBA1C, MPG No results found for: PROLACTIN No results found for: CHOL, TRIG, HDL, CHOLHDL, VLDL, LDLCALC  See Psychiatric Specialty Exam and Suicide Risk Assessment completed by Attending Physician prior to discharge.  Discharge destination:  Home  Is patient on multiple antipsychotic therapies at discharge:  No   Has Patient had three or more failed trials of antipsychotic monotherapy by history:  No  Recommended Plan for Multiple Antipsychotic Therapies: NA  Allergies as of 05/21/2018       Reactions   Penicillins Other (See Comments)   Childhood allergy; Unknown reaction  Has patient had a PCN reaction causing immediate rash, facial/tongue/throat swelling, SOB or lightheadedness with hypotension:unsure Has patient had a PCN reaction causing severe rash involving mucus membranes or skin necrosis:unsure Has patient had a PCN reaction that required hospitalization:unsure Has patient had a PCN reaction occurring within the last 10 years:unsure If all of the above answers are "NO", then may proceed with Cephalosporin use.      Medication List    STOP taking these medications   acetaminophen 500 MG tablet Commonly known as:  TYLENOL   BC HEADACHE POWDER PO     TAKE these medications     Indication  FLUoxetine 20 MG capsule Commonly known as:  PROZAC Take 1 capsule (20 mg total) by mouth daily.  Indication:  Depression  Follow-up Information    PATIENT DECLINED REFERRALS Follow up.   Why:  PATIENT DECLINED REFERRALS  Contact information: PATIENT DECLINED REFERRALS          Follow-up recommendations: Activity:  As tolerated Diet: As recommended by your primary care doctor. Keep all scheduled follow-up appointments as recommended.   Comments: Patient is instructed prior to discharge to: Take all medications as prescribed by his/her mental healthcare provider. Report any adverse effects and or reactions from the medicines to his/her outpatient provider promptly. Patient has been instructed & cautioned: To not engage in alcohol and or illegal drug use while on prescription medicines. In the event of worsening symptoms, patient is instructed to call the crisis hotline, 911 and or go to the nearest ED for appropriate evaluation and treatment of symptoms. To follow-up with his/her primary care provider for your other medical issues, concerns and or health care needs.   Signed: Armandina Stammer, NP, PMHNP, FNP-BC 05/22/2018, 9:17 AM

## 2018-05-21 NOTE — Plan of Care (Signed)
Discharge note  Patient verbalizes readiness for discharge. Follow up plan explained, AVS, Transition record and SRA given. Prescriptions and teaching provided. Belongings returned and signed for. Suicide safety plan completed and signed. Suicide hotline worksheet provided. Patient verbalizes understanding. Patient denies SI/HI and assures this Clinical research associatewriter he will seek assistance should that change. Patient discharged to lobby where father was waiting.  Problem: Education: Goal: Knowledge of [place] [facility] Education information/materials will improve Outcome: Adequate for Discharge Goal: Emotional status will improve Outcome: Adequate for Discharge Goal: Mental status will improve Outcome: Adequate for Discharge Goal: Verbalization of understanding the information provided will improve Outcome: Adequate for Discharge   Problem: Activity: Goal: Interest or engagement in activities will improve Outcome: Adequate for Discharge Goal: Sleeping patterns will improve Outcome: Adequate for Discharge   Problem: Coping: Goal: Ability to verbalize frustrations and anger appropriately will improve Outcome: Adequate for Discharge Goal: Ability to demonstrate self-control will improve Outcome: Adequate for Discharge   Problem: Health Behavior/Discharge Planning: Goal: Identification of resources available to assist in meeting health care needs will improve Outcome: Adequate for Discharge Goal: Compliance with treatment plan for underlying cause of condition will improve Outcome: Adequate for Discharge   Problem: Physical Regulation: Goal: Ability to maintain clinical measurements within normal limits will improve Outcome: Adequate for Discharge   Problem: Safety: Goal: Periods of time without injury will increase Outcome: Adequate for Discharge   Problem: Education: Goal: Knowledge of disease or condition will improve Outcome: Adequate for Discharge Goal: Understanding of discharge needs  will improve Outcome: Adequate for Discharge   Problem: Health Behavior/Discharge Planning: Goal: Ability to identify changes in lifestyle to reduce recurrence of condition will improve Outcome: Adequate for Discharge Goal: Identification of resources available to assist in meeting health care needs will improve Outcome: Adequate for Discharge   Problem: Physical Regulation: Goal: Complications related to the disease process, condition or treatment will be avoided or minimized Outcome: Adequate for Discharge   Problem: Safety: Goal: Ability to remain free from injury will improve Outcome: Adequate for Discharge   Problem: Education: Goal: Ability to make informed decisions regarding treatment will improve Outcome: Adequate for Discharge   Problem: Coping: Goal: Coping ability will improve Outcome: Adequate for Discharge   Problem: Health Behavior/Discharge Planning: Goal: Identification of resources available to assist in meeting health care needs will improve Outcome: Adequate for Discharge   Problem: Medication: Goal: Compliance with prescribed medication regimen will improve Outcome: Adequate for Discharge   Problem: Self-Concept: Goal: Ability to disclose and discuss suicidal ideas will improve Outcome: Adequate for Discharge Goal: Will verbalize positive feelings about self Outcome: Adequate for Discharge   Problem: Education: Goal: Utilization of techniques to improve thought processes will improve Outcome: Adequate for Discharge Goal: Knowledge of the prescribed therapeutic regimen will improve Outcome: Adequate for Discharge   Problem: Activity: Goal: Interest or engagement in leisure activities will improve Outcome: Adequate for Discharge Goal: Imbalance in normal sleep/wake cycle will improve Outcome: Adequate for Discharge   Problem: Coping: Goal: Coping ability will improve Outcome: Adequate for Discharge Goal: Will verbalize feelings Outcome:  Adequate for Discharge   Problem: Health Behavior/Discharge Planning: Goal: Ability to make decisions will improve Outcome: Adequate for Discharge Goal: Compliance with therapeutic regimen will improve Outcome: Adequate for Discharge   Problem: Role Relationship: Goal: Will demonstrate positive changes in social behaviors and relationships Outcome: Adequate for Discharge   Problem: Safety: Goal: Ability to disclose and discuss suicidal ideas will improve Outcome: Adequate for Discharge Goal: Ability  to identify and utilize support systems that promote safety will improve Outcome: Adequate for Discharge   Problem: Self-Concept: Goal: Will verbalize positive feelings about self Outcome: Adequate for Discharge Goal: Level of anxiety will decrease Outcome: Adequate for Discharge

## 2018-05-21 NOTE — Tx Team (Signed)
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 acetaminophen (TYLENOL) tablet 650 mg  650 mg Oral Q6H PRN Oneta Rack, NP      . FLUoxetine (PROZAC) capsule 20 mg  20 mg Oral Daily Oneta Rack, NP   20 mg at 05/21/18 0731  . hydrOXYzine (ATARAX/VISTARIL) tablet 25 mg  25 mg Oral Q6H PRN Oneta Rack, NP   25 mg at 05/20/18 2105  . loperamide (IMODIUM) capsule 2-4 mg  2-4 mg Oral PRN Oneta Rack, NP      . LORazepam (ATIVAN) tablet 1 mg  1 mg Oral Q6H PRN Oneta Rack, NP   1 mg at 05/20/18 2227  . magnesium hydroxide (MILK OF MAGNESIA) suspension 30 mL  30 mL Oral Daily PRN Oneta Rack, NP      . multivitamin with minerals tablet 1 tablet  1 tablet Oral Daily Oneta Rack, NP   1 tablet at 05/21/18 0731  . traZODone (DESYREL) tablet 50 mg  50 mg Oral QHS PRN Oneta Rack, NP   50 mg at 05/20/18 2105   PTA Medications: Medications Prior to Admission  Medication Sig Dispense Refill Last Dose  . acetaminophen (TYLENOL) 500 MG tablet Take 500 mg by mouth every 6 (six) hours as needed (for pain.).    Past Month at Unknown time  . Aspirin-Salicylamide-Caffeine (BC HEADACHE POWDER PO) Take 1 Package by mouth 3 (three) times daily as needed (pain).   Past Month at Unknown time    Patient Stressors: Marital or family conflict Substance abuse  Patient  Strengths: Ability for insight Average or above average intelligence Capable of independent living [employer] of knowledge Motivation for treatment/growth  Treatment Modalities: Medication Management, Group therapy, Case management,  1 to 1 session with clinician, Psychoeducation, Recreational therapy.   Physician Treatment Plan for Primary Diagnosis: Alcohol dependence with intoxication with complication (HCC) Long Term Goal(s): Improvement in symptoms so as ready for discharge Improvement in symptoms so as ready for discharge   Short Term Goals: Ability to identify changes in lifestyle to reduce recurrence of condition will improve Ability to verbalize feelings will improve Ability to disclose and discuss suicidal ideas Ability to demonstrate self-control will improve Ability to identify and develop effective coping behaviors will improve Ability to maintain clinical measurements within normal limits will improve Ability to identify triggers associated with substance abuse/mental health issues will improve Ability to identify changes in lifestyle to reduce recurrence of condition will improve Ability to verbalize feelings will improve Ability to disclose and discuss suicidal ideas Ability to demonstrate self-control will improve Ability to identify and develop effective coping behaviors will improve Ability to maintain clinical measurements within normal limits will improve Ability to identify triggers associated with substance abuse/mental health issues will improve  Medication Management: Evaluate patient's response, side effects, and tolerance of medication regimen.  Therapeutic Interventions: 1 to 1 sessions, Unit Group sessions and Medication administration.  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)   Alcohol-induced mood disorder (HCC)   Moderate cannabis use disorder (HCC)   Moderate cocaine use disorder (HCC)  Long Term Goal(s): Improvement in symptoms so as ready for discharge Improvement in symptoms so as ready for discharge   Short Term Goals: Ability to identify changes in lifestyle to reduce recurrence of condition will improve Ability to verbalize feelings will improve Ability to disclose and discuss suicidal ideas Ability to demonstrate self-control will improve Ability to identify and develop effective coping behaviors will improve Ability to maintain clinical measurements within normal limits will improve Ability to identify triggers associated with substance abuse/mental health issues will improve Ability to identify changes in lifestyle to reduce recurrence of condition will improve Ability to verbalize feelings will improve Ability to disclose and discuss suicidal ideas Ability to demonstrate self-control will improve Ability to identify and develop effective coping behaviors will improve Ability to maintain clinical measurements within normal limits will improve Ability to identify triggers associated with substance abuse/mental health issues will improve     Medication Management: Evaluate patient's response, side effects, and tolerance of medication regimen.  Therapeutic Interventions: 1 to 1 sessions, Unit Group sessions and Medication administration.  Evaluation of Outcomes: Adequate for Discharge   RN Treatment Plan for Primary Diagnosis: Alcohol dependence with intoxication with complication (HCC) Long Term Goal(s): Knowledge of disease and therapeutic regimen to maintain health will improve  Short Term Goals: Ability to participate in decision making will improve, Ability to verbalize feelings will improve, Ability to disclose and discuss suicidal ideas and Ability to  identify and develop effective coping behaviors will improve  Medication Management: RN will administer medications as ordered by provider, will assess and evaluate patient's response and provide education to patient for prescribed medication. RN will report any adverse and/or side effects to prescribing provider.  Therapeutic Interventions: 1 on 1 counseling sessions, Psychoeducation, Medication administration, Evaluate responses to treatment, Monitor vital signs and CBGs as ordered, Perform/monitor CIWA, COWS, AIMS and Fall Risk screenings as ordered, Perform wound care treatments as ordered.  Evaluation of Outcomes: Adequate for Discharge   LCSW Treatment Plan for Primary Diagnosis: Alcohol dependence with intoxication with complication (HCC) Long Term Goal(s): Safe transition to appropriate next level of care at discharge, Engage patient in therapeutic group addressing interpersonal concerns.  Short Term Goals: Engage patient in aftercare planning with referrals and resources  Therapeutic Interventions: Assess for all discharge needs, 1 to 1 time with Social worker, Explore available resources and support systems, Assess for adequacy in community support network, Educate family and significant other(s) on suicide prevention, Complete Psychosocial Assessment, Interpersonal group therapy.  Evaluation of Outcomes: Adequate for Discharge   Progress in Treatment: Attending groups: No. Participating in groups: No. Taking medication as prescribed: Yes. Toleration medication: Yes. Family/Significant other contact made: No, will contact:  patient declined consent Patient understands diagnosis: Yes. Discussing patient identified problems/goals with staff: Yes. Medical problems stabilized or resolved: Yes. Denies suicidal/homicidal ideation: Yes. Issues/concerns per patient self-inventory: No. Other:   New problem(s) identified: none   New Short Term/Long Term Goal(s):medication  stabilization, elimination of SI thoughts, development of comprehensive mental wellness plan.    Patient Goals:    Discharge Plan or Barriers: Patient discharged home and declined any outpatient referrals for outpatient  after care.   Reason for Continuation of Hospitalization: None   Estimated Length of Stay: Discharged, 05/21/18  Attendees: Patient: 05/21/2018 3:02 PM  Physician: Dr. Landry Mellow, MD 05/21/2018 3:02 PM  Nursing: Casimiro Needle.Kathie Rhodes, RN 05/21/2018 3:02 PM  RN Care Manager: 05/21/2018 3:02 PM  Social Worker: Baldo Daub, LCSWA 05/21/2018 3:02 PM  Recreational Therapist:  05/21/2018 3:02 PM  Other:  05/21/2018 3:02 PM  Other:  05/21/2018 3:02 PM  Other: 05/21/2018 3:02 PM    Scribe for Treatment Team: Maeola Sarah, LCSWA 05/21/2018 3:02 PM

## 2018-05-21 NOTE — [facility] Suicide Risk Assessment (Signed)
[facility] INPATIENT:  Family/Significant Other Suicide Prevention Education  Suicide Prevention Education:  Patient Refusal for Family/Significant Other Suicide Prevention Education: The patient Reginald Patton has refused to provide written consent for family/significant other to be provided Family/Significant Other Suicide Prevention Education during admission and/or prior to discharge.  Physician notified.  SPE completed with patient, as patient refused to consent to family contact. Pt was encouraged to share information with support network, ask questions, and talk about any concerns relating to SPE. Patient denies access to guns/firearms and verbalized understanding of information provided. Mobile Crisis information also provided to patient.    [name] 05/21/2018, 2:17 PM

## 2018-05-21 NOTE — [facility] Suicide Risk Assessment (Signed)
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   Blood pressure 116/80, pulse 63, temperature 97.7 F (36.5 C), temperature source Oral, resp. rate 20, height 5\' 10"  (1.778 m), weight 78 kg.Body mass index is 24.68 kg/m.  General Appearance: Casual  Eye Contact::  Fair  Speech:  Normal Rate409  Volume:  Normal  Mood:  Anxious  Affect:  Congruent  Thought Process:  Coherent and Descriptions of Associations: Intact  Orientation:  Full (Time, Place, and Person)  Thought Content:  Logical  Suicidal Thoughts:  No  Homicidal Thoughts:  No  Memory:  Immediate;   Fair Recent;   Fair Remote;   Fair  Judgement:  Intact  Insight:  Fair  Psychomotor Activity:  Normal  Concentration:  Fair  Recall:  [employer]Fair  Fund of Knowledge:Good  Language: Good  Akathisia:  Negative  Handed:  Right  AIMS (if indicated):     Assets:  Communication Skills Desire for Improvement Financial Resources/Insurance Housing Physical Health Resilience  Sleep:  Number of Hours: 5.75  Cognition: WNL  ADL's:  Intact   Mental Status Per Nursing Assessment::   On Admission:  NA  Demographic Factors:  Male, Caucasian and Low socioeconomic status  Loss Factors: NA  Historical Factors: Impulsivity  Risk Reduction Factors:   Employed and Living with another person, especially a relative  Continued  Clinical Symptoms:  Depression:   Comorbid alcohol abuse/dependence Impulsivity Alcohol/Substance Abuse/Dependencies  Cognitive Features That Contribute To Risk:  None    Suicide Risk:  Minimal: No identifiable suicidal ideation.  Patients presenting with no risk factors but with morbid ruminations; may be classified as minimal risk based on the severity of the depressive symptoms    Plan Of Care/Follow-up recommendations:  Activity:  ad lib  Antonieta PertGreg Lawson Montrez [place], MD 05/21/2018, 7:39 AM

## 2018-12-12 ENCOUNTER — Encounter (HOSPITAL_COMMUNITY): Payer: Self-pay | Admitting: Emergency Medicine

## 2018-12-12 ENCOUNTER — Ambulatory Visit (HOSPITAL_COMMUNITY)
Admission: EM | Admit: 2018-12-12 | Discharge: 2018-12-12 | Disposition: A | Discharge status: Home / Self Care | Payer: Self-pay | Attending: Emergency Medicine | Admitting: Emergency Medicine

## 2018-12-12 DIAGNOSIS — H60501 Unspecified acute noninfective otitis externa, right ear: Secondary | ICD-10-CM

## 2018-12-12 MED ORDER — NEOMYCIN-POLYMYXIN-HC 3.5-10000-1 OT SUSP: 4.0000 [drp] | Freq: Three times a day (TID) | OTIC | 0 refills | Status: AC

## 2018-12-12 NOTE — ED Triage Notes (Signed)
Pt c/o R ear fullness x2 weeks, statse "i've been messing with it and I think it made it worse."

## 2018-12-12 NOTE — ED Provider Notes (Signed)
[place]    CSN: [id number] Arrival date & time: 12/12/18  9528     History   Chief Complaint Chief Complaint  Patient presents with  . Ear Problem    HPI Mack Alvidrez is a 38 y.o. male.   Willliam Pettet presents with complaints of right ear "irritation" and discomfort for the past 2 weeks, worsening. Feels like he can't hear from the ear. No drainage. No URI symptoms. No fevers. Has been trying to cleanse the ear, has used drops, peroxide, this has not helped. Denies any previous similar. Without contributing medical history.       ROS per HPI, negative if not otherwise mentioned.      Past Medical History:  Diagnosis Date  . Anxiety   . Substance abuse [facility])     Patient Active Problem List   Diagnosis Date Noted  . Alcohol intoxication with moderate or severe use disorder ([place])   . Alcohol-induced mood disorder ([place])   . Moderate cannabis use disorder (HCC)   . Moderate cocaine use disorder ([place])   . MDD (major depressive disorder) 05/19/2018  . 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 Alcohol dependence with intoxication with complication ([place]) 41/32/4401    History reviewed. No pertinent surgical history.     Home Medications    Prior to Admission medications   Medication Sig Start Date End Date Taking? Authorizing Provider  neomycin-polymyxin-hydrocortisone (CORTISPORIN) 3.5-10000-1 OTIC suspension Place 4 drops into the right ear 3 (three) times daily for 10 days. 12/12/18 12/22/18  Zigmund Gottron, NP  FLUoxetine (PROZAC) 20 MG capsule Take 1 capsule (20 mg total) by mouth daily. 05/22/18 12/12/18  Sharma Covert, MD    Family History No family history on file.  Social History Social History   Tobacco Use  . Smoking status: Current Every Day Smoker    Packs/day: 1.00    Types: Cigarettes  . Smokeless tobacco: Never Used  Substance Use Topics  . Alcohol use: Yes    Comment:  Daily  . Drug use: Yes    Frequency: 7.0 times per week    Types: Cocaine, Marijuana    Comment: Daily use of THC; previous use of opioids     Allergies   Penicillins   Review of Systems Review of Systems   Physical Exam Triage Vital Signs ED Triage Vitals  Enc Vitals Group     BP 12/12/18 0818 (!) 134/95     Pulse Rate 12/12/18 0818 99     Resp 12/12/18 0818 16     Temp 12/12/18 0818 98.3 F (36.8 C)     Temp src --      SpO2 12/12/18 0818 100 %     Weight --      Height --      Head Circumference --      Peak Flow --      Pain Score 12/12/18 0819 3     Pain Loc --      Pain Edu? --      Excl. in [place]? --    No data found.  Updated Vital Signs BP (!) 134/95   Pulse 99   Temp 98.3 F (36.8 C)   Resp 16   SpO2 100%   Visual Acuity Right Eye Distance:   Left Eye Distance:   Bilateral Distance:    Right Eye Near:   Left Eye Near:    Bilateral Near:     Physical  Exam Constitutional:      Appearance: He is well-developed.  HENT:     Right Ear: Decreased hearing noted. Swelling present. No laceration, drainage or tenderness. No mastoid tenderness.     Left Ear: Tympanic membrane and ear canal normal.     Ears:     Comments: Occluded right canal; nursing attempted saline irrigation without any production of cerumen; on further exam and with speculum appears that canal is swollen with tissue; pink, no redness, no drainage; clusters of tissue? unable to visualize the TM; completely non tender Cardiovascular:     Rate and Rhythm: Normal rate and regular rhythm.  Pulmonary:     Effort: Pulmonary effort is normal.     Breath sounds: Normal breath sounds.  Skin:    General: Skin is warm and dry.  Neurological:     Mental Status: He is alert and oriented to person, place, and time.      UC Treatments / Results  Labs (all labs ordered are listed, but only abnormal results are displayed) Labs Reviewed - No data to display  EKG None  Radiology No  results found.  Procedures Procedures (including critical care time)  Medications Ordered in UC Medications - No data to display  Initial Impression / Assessment and Plan / UC Course  I have reviewed the triage vital signs and the nursing notes.  Pertinent labs & imaging results that were available during my care of the patient were reviewed by me and considered in my medical decision making (see chart for details).     Edematous right ear canal, no pain, no drainage. Will try drops with steroid to see if swelling will improve. Follow up with ENT if persistent. Patient verbalized understanding and agreeable to plan.     Reviewed and examined with supervising physician Dr. Delton SeeNelson.  Final Clinical Impressions(s) / UC Diagnoses   Final diagnoses:  Acute otitis externa of right ear, unspecified type     Discharge Instructions     Please use provided ear drops to help with the swelling within your ear.  May follow up with ENT as needed for persistent symptoms.     ED Prescriptions    Medication Sig Dispense Auth. Provider   neomycin-polymyxin-hydrocortisone (CORTISPORIN) 3.5-10000-1 OTIC suspension Place 4 drops into the right ear 3 (three) times daily for 10 days. 6 mL Linus MakoBurky, Remas Sobel B, NP     Controlled Substance Prescriptions [place] Controlled Substance Registry consulted? Not Applicable   Georgetta HaberBurky, Cherity Blickenstaff B, NP 12/12/18 1121

## 2018-12-12 NOTE — Discharge Instructions (Signed)
Please use provided ear drops to help with the swelling within your ear.  May follow up with ENT as needed for persistent symptoms.

## 2020-11-12 ENCOUNTER — Ambulatory Visit (HOSPITAL_COMMUNITY)
Admission: EM | Admit: 2020-11-12 | Discharge: 2020-11-12 | Disposition: A | Discharge status: Home / Self Care | Payer: Self-pay

## 2020-11-12 ENCOUNTER — Other Ambulatory Visit: Payer: Self-pay

## 2020-11-12 ENCOUNTER — Encounter (HOSPITAL_COMMUNITY): Payer: Self-pay | Admitting: *Deleted

## 2020-11-12 DIAGNOSIS — Z751 Person awaiting admission to adequate facility elsewhere: Secondary | ICD-10-CM

## 2020-11-12 NOTE — ED Triage Notes (Signed)
Pt reports ED for 3 months

## 2020-11-12 NOTE — Discharge Instructions (Addendum)
Follow up with PCP, provided information for [facility]

## 2020-11-12 NOTE — ED Provider Notes (Signed)
[facility]    CSN: [id number] Arrival date & time: 11/12/20  1154      History   Chief Complaint Chief Complaint  Patient presents with  . Pt reports ED    HPI Reginald Patton is a 40 y.o. male.   Patient denies significant PMH, here c/w erectile dysfunction x 3 months.       Past Medical History:  Diagnosis Date  . Anxiety   . Substance abuse [facility])     Patient Active Problem List   Diagnosis Date Noted  . 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 Alcohol-induced mood disorder (HCC)   . Moderate cannabis use disorder (HCC)   . Moderate cocaine use disorder (HCC)   . MDD (major depressive disorder) 05/19/2018  . MDD (major depressive disorder), recurrent severe, without psychosis (HCC) 05/18/2018  . Alcohol dependence with intoxication with complication (HCC) 05/18/2018    History reviewed. No pertinent surgical history.     Home Medications    Prior to Admission medications   Medication Sig Start Date End Date Taking? Authorizing Provider  FLUoxetine (PROZAC) 20 MG capsule Take 1 capsule (20 mg total) by mouth daily. 05/22/18 12/12/18  Antonieta Pert, MD    Family History History reviewed. No pertinent family history.  Social History Social History   Tobacco Use  . Smoking status: Current Every Day Smoker    Packs/day: 1.00    Types: Cigarettes  . Smokeless tobacco: Never Used  Substance Use Topics  . Alcohol use: Yes    Comment: Daily  . Drug use: Yes    Frequency: 7.0 times per week    Types: Cocaine, Marijuana    Comment: Daily use of THC; previous use of opioids     Allergies   Penicillins   Review of Systems Review of Systems  Genitourinary: Negative for difficulty urinating, penile discharge, penile swelling and testicular pain.     Physical Exam Triage Vital Signs ED Triage Vitals  Enc Vitals Group     BP 11/12/20 1216 127/86     Pulse Rate 11/12/20 1216 (!) 105     Resp 11/12/20  1216 16     Temp 11/12/20 1216 98.6 F (37 C)     Temp Source 11/12/20 1216 Oral     SpO2 11/12/20 1216 99 %     Weight --      Height --      Head Circumference --      Peak Flow --      Pain Score 11/12/20 1218 0     Pain Loc --      Pain Edu? --      Excl. in GC? --    No data found.  Updated Vital Signs BP 127/86   Pulse (!) 105   Temp 98.6 F (37 C) (Oral)   Resp 16   SpO2 99%   Visual Acuity Right Eye Distance:   Left Eye Distance:   Bilateral Distance:    Right Eye Near:   Left Eye Near:    Bilateral Near:     Physical Exam Vitals and nursing note reviewed.  Constitutional:      Appearance: Normal appearance. He is well-developed.  HENT:     Head: Normocephalic and atraumatic.     Nose: Nose normal.     Mouth/Throat:     Mouth: Mucous membranes are moist.  Eyes:     General: No scleral icterus.    Conjunctiva/sclera:  Conjunctivae normal.  Pulmonary:     Effort: Pulmonary effort is normal. No respiratory distress.  Musculoskeletal:     Cervical back: Normal range of motion and neck supple. No rigidity.  Skin:    General: Skin is warm and dry.     Capillary Refill: Capillary refill takes less than 2 seconds.  Neurological:     General: No focal deficit present.     Mental Status: He is alert and oriented to person, place, and time.     Motor: No weakness.     Gait: Gait normal.  Psychiatric:        Mood and Affect: Mood normal.      UC Treatments / Results  Labs (all labs ordered are listed, but only abnormal results are displayed) Labs Reviewed - No data to display  EKG   Radiology No results found.  Procedures Procedures (including critical care time)  Medications Ordered in UC Medications - No data to display  Initial Impression / Assessment and Plan / UC Course  I have reviewed the triage vital signs and the nursing notes.  Pertinent labs & imaging results that were available during my care of the patient were reviewed by me  and considered in my medical decision making (see chart for details).     Patient advised erectile dysfunction is not appropriate in the urgent care and he needs to follow up with PCP / Urology for further evaluation. Contact provided for PCP  Final Clinical Impressions(s) / UC Diagnoses   Final diagnoses:  None     Discharge Instructions     Follow up with PCP, provided information for [facility]    ED Prescriptions    None     PDMP not reviewed this encounter.   Evern Core, PA-C 11/12/20 1240

## 2021-11-13 ENCOUNTER — Emergency Department (HOSPITAL_COMMUNITY)
Admission: EM | Admit: 2021-11-13 | Discharge: 2021-11-13 | Disposition: A | Discharge status: Home / Self Care | Payer: Self-pay | Attending: Emergency Medicine | Admitting: Emergency Medicine

## 2021-11-13 ENCOUNTER — Other Ambulatory Visit: Payer: Self-pay

## 2021-11-13 ENCOUNTER — Emergency Department (HOSPITAL_COMMUNITY): Payer: Self-pay

## 2021-11-13 ENCOUNTER — Encounter (HOSPITAL_COMMUNITY): Payer: Self-pay

## 2021-11-13 DIAGNOSIS — Z23 Encounter for immunization: Secondary | ICD-10-CM | POA: Insufficient documentation

## 2021-11-13 DIAGNOSIS — W1839XA Other fall on same level, initial encounter: Secondary | ICD-10-CM | POA: Insufficient documentation

## 2021-11-13 DIAGNOSIS — S61411A Laceration without foreign body of right hand, initial encounter: Secondary | ICD-10-CM | POA: Insufficient documentation

## 2021-11-13 DIAGNOSIS — R200 Anesthesia of skin: Secondary | ICD-10-CM | POA: Insufficient documentation

## 2021-11-13 MED ORDER — OXYCODONE HCL 5 MG PO TABS: 5.0000 mg | ORAL_TABLET | Freq: Four times a day (QID) | ORAL | 0 refills | Status: AC | PRN

## 2021-11-13 MED ORDER — OXYCODONE-ACETAMINOPHEN 5-325 MG PO TABS
1.0000 | ORAL_TABLET | Freq: Once | ORAL | Status: AC
  Administered 2021-11-13: 1 via ORAL
  Filled 2021-11-13: qty 1

## 2021-11-13 MED ORDER — TETANUS-DIPHTH-ACELL PERTUSSIS 5-2.5-18.5 LF-MCG/0.5 IM SUSY
0.5000 mL | PREFILLED_SYRINGE | Freq: Once | INTRAMUSCULAR | Status: AC
  Administered 2021-11-13: 0.5 mL via INTRAMUSCULAR
  Filled 2021-11-13: qty 0.5

## 2021-11-13 MED ORDER — CEPHALEXIN 500 MG PO CAPS: 500.0000 mg | ORAL_CAPSULE | Freq: Three times a day (TID) | ORAL | 0 refills | Status: AC

## 2021-11-13 NOTE — ED Provider Triage Note (Signed)
Emergency Medicine Provider Triage Evaluation Note ? ?Reginald Patton , a 41 y.o. male  was evaluated in triage.  Patient sustained a laceration to the right hand last night.  Says that he fell through a picture frame and the glass shattered.  Says he is unable to feel his right thumb.  Able to move all his fingers.  Unknown last tetanus. ? ?Review of Systems  ?Positive: Pain, decreased sensation in thumb ?Negative: Limits in range of motion ? ?Physical Exam  ?BP 128/85 (BP Location: Right Arm)   Pulse (!) 101   Temp 98 ?F (36.7 ?C)   Resp 18   Ht 5\' 10"  (1.778 m)   Wt 56.7 kg   SpO2 100%   BMI 17.94 kg/m?  ?Gen:   Awake, no distress   ?Resp:  Normal effort  ?MSK:   Moves extremities without difficulty  ?Other:  Full range of motion at the MCPs and PIPs.  Strong radial pulse.  Bleeding controlled.  Lots of dried blood however laceration appears to be localized to the right palm ? ?Medical Decision Making  ?Medically screening exam initiated at 9:22 AM.  Appropriate orders placed.  Reginald Patton was informed that the remainder of the evaluation will be completed by another provider, this initial triage assessment does not replace that evaluation, and the importance of remaining in the ED until their evaluation is complete. ? ? ?  ?Royetta Asal, PA-C ?11/13/21 11/15/21 ? ?

## 2021-11-13 NOTE — ED Provider Notes (Signed)
?[facility] EMERGENCY DEPARTMENT ?Provider Note ? ? ?CSN: [id number] ?Arrival date & time: 11/13/21  0856 ? ?  ? ?History ? ?Chief Complaint  ?Patient presents with  ? Extremity Laceration  ? ? ? ? ?HPI ?Reginald Patton is a 41 y.o. male with a history of MDD and substance use presenting to the ED with a laceration to the right hand.  Patient states that he fell causing his right hand to go through a picture frame causing 2 lacerations on the palm of the right hand.  This happened around 10 PM last night.  He states that he was able to pull out a large piece of glass from the hand but is concerned there is still glass within the wound because he is having continued pain.  He also endorses numbness over the right thumb and decreased movement in the right thumb and index finger but related to pain.  He is not sure when his last tetanus shot was. ?  ? ?Home Medications ?Prior to Admission medications   ?Medication Sig Start Date End Date Taking? Authorizing Provider  ?cephALEXin (KEFLEX) 500 MG capsule Take 1 capsule (500 mg total) by mouth 3 (three) times daily for 7 days. 11/13/21 11/20/21 Yes Laurence Compton, MD  ?oxyCODONE (ROXICODONE) 5 MG immediate release tablet Take 1 tablet (5 mg total) by mouth every 6 (six) hours as needed for up to 2 days for severe pain. 11/13/21 11/15/21 Yes Laurence Compton, MD  ?FLUoxetine (PROZAC) 20 MG capsule Take 1 capsule (20 mg total) by mouth daily. 05/22/18 12/12/18  Antonieta Pert, MD  ?   ? ?Allergies    ?Penicillins   ? ?Review of Systems   ?Review of Systems  ?Skin:  Positive for wound.  ?Neurological:  Positive for numbness (Right thumb).  ? ?Physical Exam ?Updated Vital Signs ?BP 113/86   Pulse 76   Temp 98.2 ?F (36.8 ?C) (Oral)   Resp 18   Ht 5\' 10"  (1.778 m)   Wt 56.7 kg   SpO2 99%   BMI 17.94 kg/m?  ?Physical Exam ?Constitutional:   ?   Appearance: He is not toxic-appearing or diaphoretic.  ?HENT:  ?   Head: Normocephalic and atraumatic.  ?   Nose:  Nose normal.  ?Eyes:  ?   General: No scleral icterus. ?Cardiovascular:  ?   Rate and Rhythm: Normal rate and regular rhythm.  ?   Heart sounds: Normal heart sounds. No murmur heard. ?  No friction rub. No gallop.  ?Pulmonary:  ?   Effort: Pulmonary effort is normal. No respiratory distress.  ?   Breath sounds: Normal breath sounds. No stridor. No wheezing, rhonchi or rales.  ?Abdominal:  ?   Palpations: Abdomen is soft.  ?Musculoskeletal:  ?   Cervical back: Neck supple.  ?   Comments: He has full range of motion and full strength on extension and flexion of the right third through fifth fingers. ?He has slightly limited range of motion of the right first and second fingers on flexion due to pain.  He has full range of motion of the first and second fingers on extension.  ?Skin: ?   Comments: 0.5cm laceration over the thenar eminence of the right hand. 1cm laceration over the palm of the right hand.  ?Neurological:  ?   Mental Status: He is alert and oriented to person, place, and time.  ?   Comments: Sensation is intact in the right second through fifth fingers.  Slightly  decreased sensation over the right thumb from the tip of the thumb to the base.  Sensation is intact over the palm of the right hand.  ? ? ?ED Results / Procedures / Treatments   ?Labs ?(all labs ordered are listed, but only abnormal results are displayed) ?Labs Reviewed - No data to display ? ?EKG ?None ? ?Radiology ?DG Hand Complete Right ? ?Result Date: 11/13/2021 ?CLINICAL DATA:  Right hand laceration EXAM: RIGHT HAND - COMPLETE 3+ VIEW COMPARISON:  None Available. FINDINGS: Alignment is anatomic. No acute fracture. Joint spaces are preserved. There is no radiopaque foreign body. IMPRESSION: No acute osseous abnormality.  No radiopaque foreign body. Electronically Signed   By: Guadlupe Spanish M.D.   On: 11/13/2021 09:49   ? ?Procedures ?Procedures  ? ?Medications Ordered in ED ?Medications  ?oxyCODONE-acetaminophen (PERCOCET/ROXICET) 5-325 MG  per tablet 1 tablet (1 tablet Oral Given 11/13/21 0932)  ?Tdap (BOOSTRIX) injection 0.5 mL (0.5 mLs Intramuscular Given 11/13/21 1534)  ?oxyCODONE-acetaminophen (PERCOCET/ROXICET) 5-325 MG per tablet 1 tablet (1 tablet Oral Given 11/13/21 1534)  ? ? ?ED Course/ Medical Decision Making/ A&P ?  ?                        ?Medical Decision Making ?Risk ?Prescription drug management. ? ? ?41 year old male with a history of MDD and substance use presenting to the ED with lacerations to the right hand. ? ?On exam, the patient does have 2 small lacerations over the palmar aspect of the right hand.  He has slightly decreased sensation over the right thumb but sensation is otherwise intact over the entirety of the right hand.  He has some very slightly limited flexion of the right index and thumb, mostly due to pain. ? ?Patient was given p.o. oxycodone for pain control.  Tdap was also updated. ? ?After thoroughly cleansing the wound with sterile saline and Betadine, wounds were carefully explored and did not appear to have any foreign bodies within them.  X-ray of the right hand was also negative for acute fracture or retained foreign body. ? ?Spoke with Earney Hamburg, PA with hand surgery consult and he is recommended outpatient follow-up this week with Dr. Izora Ribas -patient was given his information to arrange a follow-up appointment. ? ?Given the initial injury happened over 12 hours ago, will leave the lacerations open due to high risk of infection with closure.  Patient was also initiated on Keflex x7 days given the high risk of infection.  Patient was given verbal and written instructions for wound care.  Strict return precautions were discussed and the patient was discharged home in stable condition. ? ?Final Clinical Impression(s) / ED Diagnoses ?Final diagnoses:  ?Laceration of right palm, initial encounter  ? ? ?Rx / DC Orders ?ED Discharge Orders   ? ?      Ordered  ?  oxyCODONE (ROXICODONE) 5 MG immediate release  tablet  Every 6 hours PRN       ? 11/13/21 1629  ?  cephALEXin (KEFLEX) 500 MG capsule  3 times daily       ? 11/13/21 1631  ? ?  ?  ? ?  ? ? ?  ?Laurence Compton, MD ?11/13/21 1633 ? ?  ?Glynn Octave, MD ?11/13/21 2100 ? ?

## 2021-11-13 NOTE — Discharge Instructions (Addendum)
Start Keflex, 1 tablet 3 times a day for 7 days. ?Alternate Tylenol Motrin every 3-4 hours as needed for pain.  You can also take oxycodone, 1 pill every 6 hours as needed for severe pain. ?Please call the number provided to schedule appointment with Dr. Izora Ribas with hand surgery for an appointment this week. ?

## 2021-11-13 NOTE — ED Triage Notes (Signed)
Pt arrived POV from home c/o a right hand laceration. Pt is anxious screaming that he lost a lot of blood and that he put his hand through a picture frame last night around 10pm but could not make it here. Pt states he cannot feel his thumb. Bleeding is controlled.   ?

## 2021-11-13 NOTE — ED Notes (Signed)
Patient verbalizes understanding of discharge instructions. Opportunity for questioning and answers were provided. Armband removed by staff, pt discharged from ED.  

## 2023-09-30 ENCOUNTER — Ambulatory Visit: Payer: Self-pay | Admitting: *Deleted

## 2023-09-30 NOTE — Telephone Encounter (Signed)
 Copied from CRM [id number]. Topic: Clinical - Red Word Triage >> Sep 30, 2023  9:40 AM Tiffany B wrote: [facility] that prompted transfer to Nurse Triage: Caller states patient is experiencing severe ear pain. Caller was dx with cancer and spreading to lymph nodes, chest pain and vomiting. Reason for Disposition  [1] SEVERE pain AND [2] not improved 2 hours after taking analgesic medication (e.g., ibuprofen or acetaminophen)    Referred to the urgent care since he does not have a PCP yet.  Answer Assessment - Initial Assessment Questions 1. 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     I'm getting sick a lot.    Suddenly I'm fine then I have to vomit.   My muscles hurt in my arms and my whole body really bad and in my chest. I'm for surgery soon.  My lymph node needs to be removed.     2. ONSET: "When did the ear start hurting"      I had surgery on ear a few months ago.   The biopsy came back cancer.    They have to do more surgery on my ear and remove right sided lymph node.   My whole body is hurting so bad all the time.   Nothing helps like Tylenol or Excedrin.     3. SEVERITY: "How bad is the pain?"  (Scale 1-10; mild, moderate or severe)   - MILD (1-3): doesn't interfere with normal activities    - MODERATE (4-7): interferes with normal activities or awakens from sleep    - SEVERE (8-10): excruciating pain, unable to do any normal activities      Pain It's in [place] in [place].   It's [facility].    4. URI SYMPTOMS: "Do you have a runny nose or cough?"     No runny nose, fever or coughing 5. FEVER: "Do you have a fever?" If Yes, ask: "What is your temperature, how was it measured, and when did it start?"     No 6. CAUSE: "Have you been swimming recently?", "How often do you use Q-TIPS?", "Have you had any recent air travel or scuba diving?"     No 7. OTHER SYMPTOMS: "Do you have  any other symptoms?" (e.g., headache, stiff neck, dizziness, vomiting, runny nose, decreased hearing)     My whole body hurts.   I vomit suddenly for no reason. I don't have insurance right now but the finance dept is working on that for me now. 8. 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   For surgery to remove a lymph node but his main complaint is body pain and ear pain. Symptoms: Vomiting just happens for no reason.   Body aches all over.  Muscles all over my body hurt and Tylenol and Excedrin and all not helping.  I don't know if this is related to the cancer or not.  They are going to remove a lymph node soon.  My whole body hurts hurts all the time. Pt does not have a PCP but was set up with one by the agent prior to transferring him over to me.    He has a new pt appt with [facility] on  10/02/2023. Frequency: daily for the last couple of weeks it's been worse the body pain all over.   It's been going on for about 2-3 months though. Pertinent Negatives: Patient denies being on chemo or radiation treatments yet.   No runny nose throat or fever.   Just body aches and ear pain Disposition: [] ED /[x] Urgent Care (no appt availability in office) / [] Appointment(In office/virtual)/ []  [facility]/ [] Home Care/ [] Refused Recommended Disposition /[] [facility]/ []  Follow-up with PCP Additional Notes: I have referred him to the urgent care for pain medication until he can be established with new PCP on 10/02/2023.    He was agreeable to this plan.

## 2023-10-02 ENCOUNTER — Ambulatory Visit (INDEPENDENT_AMBULATORY_CARE_PROVIDER_SITE_OTHER): Payer: Self-pay | Admitting: Family

## 2023-10-02 ENCOUNTER — Encounter: Payer: Self-pay | Admitting: Family

## 2023-10-02 VITALS — BP 128/74 | HR 80 | Temp 97.9°F | Resp 19 | Ht 72.0 in | Wt 187.0 lb

## 2023-10-02 DIAGNOSIS — R5383 Other fatigue: Secondary | ICD-10-CM

## 2023-10-02 DIAGNOSIS — Z1159 Encounter for screening for other viral diseases: Secondary | ICD-10-CM

## 2023-10-02 DIAGNOSIS — C44202 Unspecified malignant neoplasm of skin of right ear and external auricular canal: Secondary | ICD-10-CM

## 2023-10-02 DIAGNOSIS — Z113 Encounter for screening for infections with a predominantly sexual mode of transmission: Secondary | ICD-10-CM

## 2023-10-02 DIAGNOSIS — C779 Secondary and unspecified malignant neoplasm of lymph node, unspecified: Secondary | ICD-10-CM

## 2023-10-02 DIAGNOSIS — Z13220 Encounter for screening for lipoid disorders: Secondary | ICD-10-CM

## 2023-10-02 DIAGNOSIS — R079 Chest pain, unspecified: Secondary | ICD-10-CM

## 2023-10-02 DIAGNOSIS — Z7689 Persons encountering health services in other specified circumstances: Secondary | ICD-10-CM

## 2023-10-02 NOTE — Progress Notes (Signed)
 Provider: Richarda Blade FNP-C   Baltasar Twilley, Donalee Citrin, NP  Patient Care Team: Abdinasir Spadafore, Donalee Citrin, NP as PCP - General (Family Medicine)  Extended Emergency Contact Information Primary Emergency Contact: Castronova,Douglas Address: [address]           [place], [place] [address] [place] of [place] Home Phone: [phone] Relation: Father  Code Status:  Full Code  Goals of care: Advanced Directive information    10/02/2023    9:03 AM  Advanced Directives  Does Patient Have a Medical Advance Directive? No  Would patient like information on creating a medical advance directive? No - Patient declined     Chief Complaint  Patient presents with   Establish Care    New patient appointment.     Discussed the use of AI scribe software for clinical note transcription with the patient, who gave verbal consent to proceed.  History of Present Illness   Reginald Patton is a 43 year old male with squamous cell carcinoma who presents to establish care and address body pain.  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 He is awaiting surgery to remove the affected lymph node and part of the temporal bone. The cancer affects the inside of his ear.  He experiences severe body pain, particularly in his arms and shoulders, which has been worsening over the past few weeks. The pain is described as a 'real bad achy feeling' and has become intolerable. There is also a tingling sensation in the left arm and generalized muscle aches that have intensified recently.  He has a history of smoking half a pack of cigarettes per day since the age of 4 and works in [employer], which involves physical labor. He attributes some of his physical activity to his job. He reports occasional alcohol use and denies any current use of recreational drugs. He is currently uninsured and awaiting a call regarding Medicaid assistance. He has not received a  COVID vaccine and does not regularly get flu shots.  In the review of symptoms, he reports occasional ear pain, particularly on the right side. No significant chest pain, shortness of breath, palpitations, fever, chills, night sweats, or changes in bowel or urinary habits. He sleeps 5 to 6 hours per night and denies any history of depression or anxiety.   Past Medical History:  Diagnosis Date   Anxiety    Cancer [facility])    Substance abuse (HCC)    History reviewed. No pertinent surgical history.  Allergies  Allergen Reactions   Penicillins Other (See Comments)    Childhood allergy; Unknown reaction  Has patient had a PCN reaction causing immediate rash, facial/tongue/throat swelling, SOB or lightheadedness with hypotension:unsure Has patient had a PCN reaction causing severe rash involving mucus membranes or skin necrosis:unsure Has patient had a PCN reaction that required hospitalization:unsure Has patient had a PCN reaction occurring within the last 10 years:unsure If all of the above answers are "NO", then may proceed with Cephalosporin use.     Allergies as of 10/02/2023       Reactions   Penicillins Other (See Comments)   Childhood allergy; Unknown reaction  Has patient had a PCN reaction causing immediate rash, facial/tongue/throat swelling, SOB or lightheadedness with hypotension:unsure Has patient had a PCN reaction causing severe rash involving mucus membranes or skin necrosis:unsure Has patient had a PCN reaction that required hospitalization:unsure Has patient had a PCN reaction occurring within the last 10  years:unsure If all of the above answers are "NO", then may proceed with Cephalosporin use.        Medication List    as of October 02, 2023  9:51 PM   You have not been prescribed any medications.     Review of Systems  Constitutional:  Negative for appetite change, chills, fatigue, fever and unexpected weight change.  HENT:  Negative for congestion, dental  problem, ear discharge, ear pain, facial swelling, hearing loss, nosebleeds, postnasal drip, rhinorrhea, sinus pressure, sinus pain, sneezing, sore throat, tinnitus and trouble swallowing.        Right ear squamous cell cancer   Eyes:  Negative for pain, discharge, redness, itching and visual disturbance.  Respiratory:  Negative for cough, chest tightness, shortness of breath and wheezing.   Cardiovascular:  Negative for chest pain, palpitations and leg swelling.  Gastrointestinal:  Negative for abdominal distention, abdominal pain, blood in stool, constipation, diarrhea, nausea and vomiting.  Endocrine: Negative for cold intolerance, heat intolerance, polydipsia, polyphagia and polyuria.  Genitourinary:  Negative for difficulty urinating, dysuria, flank pain, frequency and urgency.  Musculoskeletal:  Positive for myalgias. Negative for arthralgias, back pain, gait problem, joint swelling, neck pain and neck stiffness.       Muscle pain on the arms   Skin:  Negative for color change, pallor, rash and wound.  Neurological:  Negative for dizziness, syncope, speech difficulty, weakness, light-headedness, numbness and headaches.  Hematological:  Does not bruise/bleed easily.  Psychiatric/Behavioral:  Negative for agitation, behavioral problems, confusion, hallucinations, self-injury, sleep disturbance and suicidal ideas. The patient is not nervous/anxious.     Immunization History  Administered Date(s) Administered   Tdap 11/13/2021   Pertinent  Health Maintenance Due  Topic Date Due   INFLUENZA VACCINE  01/31/2024      05/21/2018    7:30 AM 12/12/2018    8:19 AM 11/12/2020   12:20 PM 11/13/2021    9:13 AM 10/02/2023    9:03 AM  Fall Risk  Falls in the past year?     1  Was there an injury with Fall?     0  Fall Risk Category Calculator     1  (RETIRED) Patient Fall Risk Level  Low fall risk Low fall risk Low fall risk   Patient at Risk for Falls Due to     No Fall Risks  Fall risk Follow  up     Falls evaluation completed     Information is confidential and restricted. Go to Review Flowsheets to unlock data.   Functional Status Survey:    Vitals:   10/02/23 0908  BP: 128/74  Pulse: 80  Resp: 19  Temp: 97.9 F (36.6 C)  SpO2: 98%  Weight: 187 lb (84.8 kg)  Height: 6' (1.829 m)   Body mass index is 25.36 kg/m. Physical Exam GENERAL: Alert, cooperative, well developed, no acute distress. HEENT: Normocephalic, normal oropharynx, moist mucous membranes. Right ear with tenderness and lymphadenopathy. NECK: Right post-auricular lymphadenopathy. CHEST: Clear to auscultation bilaterally, no wheezes, rhonchi, or crackles. CARDIOVASCULAR: Normal heart rate and rhythm, S1 and S2 normal without murmurs. ABDOMEN: Soft, non-tender, non-distended, without organomegaly, normal bowel sounds. EXTREMITIES: No cyanosis or edema, without swelling. MUSCULOSKELETAL: Shoulder and arm with achiness. NEUROLOGICAL: Cranial nerves grossly intact, moves all extremities without gross motor or sensory deficit. SKIN: No rash, No lesion or erythema  PSYCHIATRY/BEHAVIORAL: Mood stable    Labs reviewed: No results for input(s): "NA", "K", "CL", "CO2", "GLUCOSE", "BUN", "CREATININE", "CALCIUM", "MG", "  PHOS" in the last 8760 hours. No results for input(s): "AST", "ALT", "ALKPHOS", "BILITOT", "PROT", "ALBUMIN" in the last 8760 hours. No results for input(s): "WBC", "NEUTROABS", "HGB", "HCT", "MCV", "PLT" in the last 8760 hours. Lab Results  Component Value Date   TSH 1.489 05/20/2018   No results found for: "HGBA1C" No results found for: "CHOL", "HDL", "LDLCALC", "LDLDIRECT", "TRIG", "CHOLHDL"  Significant Diagnostic Results in last 30 days:  No results found.  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 Awaiting surgery, estimated cost $60,000-$80,000. Uninsured, applying for Medicaid with  hospital assistance. Experiences mild ear pain. - Coordinate with [facility] for surgery - Awaiting Medicaid application process at [facility]  - Educate about the role of lymph nodes  Muscle Pain Severe muscle pain in arms, shoulders, and chest, worsening over weeks, described as achy with fatigue. Etiology unclear, possibly related to cancer diagnosis. Affects daily activities and construction work. - Order EKG to assess cardiac function - Schedule blood work including CBC, chemistry panel, and cholesterol screening  Tobacco Use Disorder Smoking since age 4, currently half a pack per day. Previous cessation attempts failed. Smoking cessation critical due to cancer. Discussed nicotine gum, patches, and Wellbutrin; concerned about cost. - Discuss smoking cessation options including nicotine gum, patches, and Wellbutrin - Offer assistance with smoking cessation medications if needed  General Health Maintenance Irregular primary care visits, no routine vaccinations. Works in [employer], no vision issues, chest pain, or dyspnea. Smokes, occasional alcohol use, no recreational drugs. Family history of diabetes. Lacks flu and COVID-19 vaccinations. - Schedule blood work including CBC, chemistry panel, cholesterol screening, hepatitis C, and HIV screening - Discuss importance of regular check-ups and vaccinations - Educate about benefits of flu and COVID-19 vaccinations  Follow-up Requires follow-up for ongoing health issues and cancer care coordination. Awaiting Medicaid application outcome for medical interventions. - Schedule follow-up appointment after blood work results - Coordinate care with cancer treatment specialists   Family/ staff Communication: Reviewed plan of care with patient verbalized understanding   Labs/tests ordered:  - CBC with Differential/Platelet - CMP with eGFR([facility]) - TSH - Lipid panel - Hep C Antibody - HIV Antibody  (routine testing w rflx)   Next Appointment : Return in about 1 year (around 10/01/2024) for annual Physical examination.   Spent 45 minutes of Face to face and non-face to face with patient  >50% time spent counseling; reviewing medical record; tests; labs; documentation and developing future plan of care.   Caesar Bookman, NP
